# Patient Record
Sex: Female | Born: 1940 | Race: Black or African American | Hispanic: No | Marital: Married | Smoking: Former smoker
Health system: Southern US, Community
[De-identification: ages and names within clinical notes are randomized; demographics above are authoritative.]

## PROBLEM LIST (undated history)

## (undated) DIAGNOSIS — R3129 Other microscopic hematuria: Secondary | ICD-10-CM

## (undated) DIAGNOSIS — R011 Cardiac murmur, unspecified: Secondary | ICD-10-CM

## (undated) DIAGNOSIS — T7840XA Allergy, unspecified, initial encounter: Secondary | ICD-10-CM

## (undated) DIAGNOSIS — H269 Unspecified cataract: Secondary | ICD-10-CM

## (undated) DIAGNOSIS — E785 Hyperlipidemia, unspecified: Secondary | ICD-10-CM

## (undated) DIAGNOSIS — I10 Essential (primary) hypertension: Secondary | ICD-10-CM

## (undated) DIAGNOSIS — R739 Hyperglycemia, unspecified: Secondary | ICD-10-CM

## (undated) HISTORY — PX: COLONOSCOPY: SHX174

## (undated) HISTORY — DX: Hyperglycemia, unspecified: R73.9

## (undated) HISTORY — PX: ABDOMINAL HYSTERECTOMY: SHX81

## (undated) HISTORY — DX: Other microscopic hematuria: R31.29

## (undated) HISTORY — DX: Unspecified cataract: H26.9

## (undated) HISTORY — DX: Cardiac murmur, unspecified: R01.1

## (undated) HISTORY — DX: Allergy, unspecified, initial encounter: T78.40XA

## (undated) HISTORY — PX: CATARACT EXTRACTION: SUR2

## (undated) HISTORY — PX: TRIGGER FINGER RELEASE: SHX641

## (undated) HISTORY — DX: Hyperlipidemia, unspecified: E78.5

## (undated) HISTORY — DX: Essential (primary) hypertension: I10

---

## 1983-01-12 HISTORY — PX: TOTAL ABDOMINAL HYSTERECTOMY: SHX209

## 2004-09-04 ENCOUNTER — Ambulatory Visit (HOSPITAL_COMMUNITY): Admission: RE | Admit: 2004-09-04 | Discharge: 2004-09-04 | Payer: Self-pay | Admitting: Gastroenterology

## 2010-12-02 DIAGNOSIS — E119 Type 2 diabetes mellitus without complications: Secondary | ICD-10-CM | POA: Insufficient documentation

## 2010-12-02 DIAGNOSIS — IMO0002 Reserved for concepts with insufficient information to code with codable children: Secondary | ICD-10-CM | POA: Insufficient documentation

## 2014-10-11 ENCOUNTER — Encounter: Payer: Self-pay | Admitting: Gastroenterology

## 2014-10-29 ENCOUNTER — Ambulatory Visit (AMBULATORY_SURGERY_CENTER): Payer: Self-pay | Admitting: *Deleted

## 2014-10-29 VITALS — Ht 62.0 in | Wt 146.2 lb

## 2014-10-29 DIAGNOSIS — Z1211 Encounter for screening for malignant neoplasm of colon: Secondary | ICD-10-CM

## 2014-10-29 NOTE — Progress Notes (Signed)
No egg or soy allergy  No anesthesia or intubation problems per pt  No diet medications taken   

## 2014-11-06 ENCOUNTER — Telehealth: Payer: Self-pay | Admitting: Gastroenterology

## 2014-11-06 NOTE — Telephone Encounter (Signed)
Called patient, explained to her that she was given the Miralax split dose prep and that everything for this prep is purchased over the counter Told her to carry her prep instructions with her to her pharmacy and they would help her with what she needs

## 2014-11-12 ENCOUNTER — Ambulatory Visit (AMBULATORY_SURGERY_CENTER): Payer: Medicare Other | Admitting: Gastroenterology

## 2014-11-12 ENCOUNTER — Encounter: Payer: Self-pay | Admitting: Gastroenterology

## 2014-11-12 VITALS — BP 111/54 | HR 57 | Temp 97.6°F | Resp 19 | Ht 62.0 in | Wt 146.0 lb

## 2014-11-12 DIAGNOSIS — Z1211 Encounter for screening for malignant neoplasm of colon: Secondary | ICD-10-CM | POA: Diagnosis not present

## 2014-11-12 MED ORDER — SODIUM CHLORIDE 0.9 % IV SOLN: 500.0000 mL | INTRAVENOUS | Status: DC

## 2014-11-12 NOTE — Patient Instructions (Signed)
YOU HAD AN ENDOSCOPIC PROCEDURE TODAY AT THE [facility]:   Refer to the procedure report that was given to you for any specific questions about what was found during the examination.  If the procedure report does not answer your questions, please call your gastroenterologist to clarify.  If you requested that your care partner not be given the details of your procedure findings, then the procedure report has been included in a sealed envelope for you to review at your convenience later.  YOU SHOULD EXPECT: Some feelings of bloating in the abdomen. Passage of more gas than usual.  Walking can help get rid of the air that was put into your GI tract during the procedure and reduce the bloating. If you had a lower endoscopy (such as a colonoscopy or flexible sigmoidoscopy) you may notice spotting of blood in your stool or on the toilet paper. If you underwent a bowel prep for your procedure, you may not have a normal bowel movement for a few days.  Please Note:  You might notice some irritation and congestion in your nose or some drainage.  This is from the oxygen used during your procedure.  There is no need for concern and it should clear up in a day or so.  SYMPTOMS TO REPORT IMMEDIATELY:   Following lower endoscopy (colonoscopy or flexible sigmoidoscopy):  Excessive amounts of blood in the stool  Significant tenderness or worsening of abdominal pains  Swelling of the abdomen that is new, acute  Fever of 100F or higher   For urgent or emergent issues, a gastroenterologist can be reached at any hour by calling [phone].   DIET: Your first meal following the procedure should be a small meal and then it is ok to progress to your normal diet. Heavy or fried foods are harder to digest and may make you feel nauseous or bloated.  Likewise, meals heavy in dairy and vegetables can increase bloating.  Drink plenty of fluids but you should avoid alcoholic beverages for 24  hours.  ACTIVITY:  You should plan to take it easy for the rest of today and you should NOT DRIVE or use heavy machinery until tomorrow (because of the sedation medicines used during the test).    FOLLOW UP: Our staff will call the number listed on your records the next business day following your procedure to check on you and address any questions or concerns that you may have regarding the information given to you following your procedure. If we do not reach you, we will leave a message.  However, if you are feeling well and you are not experiencing any problems, there is no need to return our call.  We will assume that you have returned to your regular daily activities without incident.  If any biopsies were taken you will be contacted by phone or by letter within the next 1-3 weeks.  Please call us at [phone] if you have not heard about the biopsies in 3 weeks.    SIGNATURES/CONFIDENTIALITY: You and/or your care partner have signed paperwork which will be entered into your electronic medical record.  These signatures attest to the fact that that the information above on your After Visit Summary has been reviewed and is understood.  Full responsibility of the confidentiality of this discharge information lies with you and/or your care-partner.  Hemorrhoids, diverticulosis, high fiber diet-handouts given  No repeat screening colonoscopy due to age unless you have any problems.

## 2014-11-12 NOTE — Op Note (Signed)
[facility] [address] [place], [address]   COLONOSCOPY PROCEDURE REPORT  PATIENT: [name], [name]: 1940-11-07 , 74  yrs. old GENDER: female ENDOSCOPIST: Marsa ArisKavitha Colin Ellers, MD REFERRED XB:JYNWGNBY:Daniel Jarold MottoPatterson, M.D. PROCEDURE DATE:  11/12/2014 PROCEDURE:   Colonoscopy, screening First Screening Colonoscopy - Avg.  risk and is 50 yrs.  old or older Yes.  Prior Negative Screening - Now for repeat screening. 10 or more years since last screening  History of Adenoma - Now for follow-up colonoscopy & has been > or = to 3 yrs.  N/A  Polyps removed today? No Recommend repeat exam, <10 yrs? No ASA CLASS:   Class II INDICATIONS:Screening for colonic neoplasia and Colorectal Neoplasm Risk Assessment for this procedure is average risk. MEDICATIONS: Propofol 350 mg IV and Lidocaine 40 mg IV  DESCRIPTION OF PROCEDURE:   After the risks benefits and alternatives of the procedure were thoroughly explained, informed consent was obtained.  The digital rectal exam revealed no abnormalities of the rectum.   The LB 1528  endoscope was introduced through the anus and advanced to the cecum, which was identified by both the appendix and ileocecal valve. No adverse events experienced.   The quality of the prep was good.  The instrument was then slowly withdrawn as the colon was fully examined. Estimated blood loss is zero unless otherwise noted in this procedure report.   COLON FINDINGS: Boston prep score 3-3-3 (9) colonic mucosa appeared normal, few scattered sigmoid diverticula. Small internal hemorrhoids nonbleeding.  Poor anal sphincter tone, unable to hold air in the rectum and distal sigmoid colon but was able to visualize mucosa adequately.  Retroflexed views revealed internal hemorrhoids. The time to cecum = 14.6 Withdrawal time = 9.2   The scope was withdrawn and the procedure completed. COMPLICATIONS: There were no immediate  complications.  ENDOSCOPIC IMPRESSION: Sigmoid diverticulosis. Small internal hemorrhoids nonbleeding. Poor anal sphincter tone  RECOMMENDATIONS: Given your age, you will not need another colonoscopy for colon cancer screening or polyp surveillance.  These types of tests usually stop around the age 75. Anorectal manometry and pelvic floor physical therapy to improve sphincter tone  eSigned:  Marsa ArisKavitha Derren Suydam, MD 11/12/2014 10:06 AM      [name], [name] MR#: [id number]

## 2014-11-12 NOTE — Progress Notes (Signed)
Stable to RR 

## 2014-11-13 ENCOUNTER — Telehealth: Payer: Self-pay

## 2014-11-13 NOTE — Telephone Encounter (Signed)
  Follow up Call-  Call back number 11/12/2014  Post procedure Call Back phone  # [phone]  Permission to leave phone message Yes     Patient questions:  Do you have a fever, pain , or abdominal swelling? No. Pain Score  0 *  Have you tolerated food without any problems? Yes.    Have you been able to return to your normal activities? Yes.    Do you have any questions about your discharge instructions: Diet   No. Medications  No. Follow up visit  No.  Do you have questions or concerns about your Care? Yes.    Actions: * If pain score is 4 or above: No action needed, pain <4.  Pt said she still feel gasy today.  Pt said she was passing gas.  I advised her to continue to pass gas.  Avoid gasy foods until it resolves.  To call us back if not improving today. maw

## 2014-11-26 ENCOUNTER — Other Ambulatory Visit: Payer: Self-pay

## 2014-11-26 DIAGNOSIS — K6289 Other specified diseases of anus and rectum: Secondary | ICD-10-CM

## 2014-11-26 DIAGNOSIS — R159 Full incontinence of feces: Secondary | ICD-10-CM

## 2014-12-30 ENCOUNTER — Ambulatory Visit (HOSPITAL_COMMUNITY)
Admission: RE | Admit: 2014-12-30 | Discharge: 2014-12-30 | Disposition: A | Discharge status: Home / Self Care | Payer: Medicare Other | Source: Ambulatory Visit | Attending: Gastroenterology | Admitting: Gastroenterology

## 2014-12-30 ENCOUNTER — Encounter (HOSPITAL_COMMUNITY)
Admission: RE | Disposition: A | Discharge status: Home / Self Care | Payer: Self-pay | Source: Ambulatory Visit | Attending: Gastroenterology

## 2014-12-30 DIAGNOSIS — K6289 Other specified diseases of anus and rectum: Secondary | ICD-10-CM | POA: Diagnosis present

## 2014-12-30 DIAGNOSIS — R159 Full incontinence of feces: Secondary | ICD-10-CM | POA: Diagnosis not present

## 2014-12-30 HISTORY — PX: ANAL RECTAL MANOMETRY: SHX6358

## 2014-12-30 SURGERY — MANOMETRY, ANORECTAL

## 2014-12-31 ENCOUNTER — Encounter (HOSPITAL_COMMUNITY): Payer: Self-pay | Admitting: Gastroenterology

## 2015-02-06 DIAGNOSIS — R159 Full incontinence of feces: Secondary | ICD-10-CM | POA: Insufficient documentation

## 2015-04-15 ENCOUNTER — Encounter: Payer: Self-pay | Admitting: Gastroenterology

## 2015-04-15 ENCOUNTER — Other Ambulatory Visit: Payer: Self-pay

## 2015-04-15 DIAGNOSIS — K5902 Outlet dysfunction constipation: Secondary | ICD-10-CM

## 2015-04-24 ENCOUNTER — Encounter: Payer: Self-pay | Admitting: Physical Therapy

## 2015-04-24 ENCOUNTER — Ambulatory Visit: Payer: Medicare Other | Attending: Gastroenterology | Admitting: Physical Therapy

## 2015-04-24 DIAGNOSIS — R279 Unspecified lack of coordination: Secondary | ICD-10-CM | POA: Diagnosis present

## 2015-04-24 DIAGNOSIS — M6281 Muscle weakness (generalized): Secondary | ICD-10-CM

## 2015-04-24 NOTE — Therapy (Signed)
[facility] [address], [address] [place], [place], [address] Phone: [phone]   Fax:  [phone]  Physical Therapy Evaluation  Patient Details  Name: Geneieve Duell MRN: [id number] Date of Birth: [birth year] Referring Provider: Dr. Marsa Aris  Encounter Date: 04/24/2015      PT End of Session - 04/24/15 0851    Visit Number 1   Number of Visits 10   Date for PT Re-Evaluation 06/19/15   Authorization Type g-code 10th visti; Kx modifier on 15th   PT Start Time 0845   PT Stop Time 0925   PT Time Calculation (min) 40 min   Activity Tolerance Patient tolerated treatment well   Behavior During Therapy [facility] for tasks assessed/performed      Past Medical History  Diagnosis Date  . Allergy   . Cataract   . Heart murmur   . Hyperlipidemia   . Hypertension     Past Surgical History  Procedure Laterality Date  . Colonoscopy    . Abdominal hysterectomy    . Trigger finger release      left  . Anal rectal manometry N/A 12/30/2014    Procedure: ANO RECTAL MANOMETRY;  Surgeon: Napoleon Form, MD;  Location: [facility];  Service: Endoscopy;  Laterality: N/A;    There were no vitals filed for this visit.       Subjective Assessment - 04/24/15 0856    Subjective Patient reports she has been constipated. When have a bowel movement she will ses leakage later on in the day.  No urinary leakage.    Patient Stated Goals reduce the leakage.   Currently in Pain? No/denies            [facility] PT Assessment - 04/24/15 0001    Assessment   Medical Diagnosis K59.02 outlet dysfunction constipation   Referring Provider Dr. Marsa Aris   Prior Therapy None   Precautions   Precautions None   Restrictions   Weight Bearing Restrictions No   Balance Screen   Has the patient fallen in the past 6 months No   Has the patient had a decrease in activity level because of a fear of falling?  No   Is the patient  reluctant to leave their home because of a fear of falling?  No   Prior Function   Level of Independence Independent   Vocation Retired   Leisure goes to gym   Cognition   Overall Cognitive Status Within Functional Limits for tasks assessed   Observation/Other Assessments   Focus on Therapeutic Outcomes (FOTO)  41% limitation for boewl leakage survey Ck  goal is 30% limitation   Posture/Postural Control   Posture/Postural Control No significant limitations   ROM / Strength   AROM / PROM / Strength AROM;PROM;Strength   AROM   Overall AROM  Within functional limits for tasks performed   PROM   Right Hip External Rotation  40   Left Hip External Rotation  50   Strength   Right Hip ABduction 4/5   Left Hip ABduction 4/5   Palpation   Palpation comment firmness in abdomen                 Pelvic Floor Special Questions - 04/24/15 0001    Urinary Leakage No   Fecal incontinence Yes   Skin Integrity Intact   Pelvic Floor Internal Exam Patient confirmed identifiecation and approved therapist to assess muscle integrity and strength   Exam Type Rectal  Palpation No tenderness   Strength fair squeeze, definite lift   Strength # of seconds 4                  PT Education - 04/24/15 0917    Education provided Yes   Education Details abdominal massage, pelvis floor exericse   Person(s) Educated Patient   Methods Explanation;Demonstration;Handout;Verbal cues   Comprehension Verbalized understanding;Returned demonstration          PT Short Term Goals - 04/24/15 0927    PT SHORT TERM GOAL #1   Title Independent with HEP   Time 4   Period Weeks   Status New   PT SHORT TERM GOAL #2   Title understand how to massage the abdomen to promote bwel movement   Time 4   Period Weeks   Status New   PT SHORT TERM GOAL #3   Title ability  to contract the pelvic floor without contracting gluteals   Time 4   Period Weeks   Status New   PT SHORT TERM GOAL #4    Title abilit to contract pelvic floor while breathing   Time 4   Period Weeks   Status New   PT SHORT TERM GOAL #5   Title fecal leakage decreased >/= 25%   Time 4   Period Weeks   Status New           PT Long Term Goals - 04/24/15 [id number]    PT LONG TERM GOAL #1   Title Independent with HEP   Time 8   Period Weeks   Status New   PT LONG TERM GOAL #2   Title pelvic floor strength >/= 4/5 holding for 10 seconds   Time 8   Period Weeks   Status New   PT LONG TERM GOAL #3   Title fecal leakage decreased >/= 80%   Time 8   Period Weeks   Status New               Plan - 04/24/15 [id number]    Clinical Impression Statement Patient is a 75 year old female with diagnosis of outlet dysfunction constipation for several months. Patient reports no pain.  FOTO score is 41% limitation.  Patient reports 3 bowel movements per week.  Patient will leak feces  after she has had a bowel movement and ben doing her activities.  Patient reports her bowel movements are soft.  Patient  does not wear pads.  Pelvic floor strength is 3/5 holding for 3 seconds.  Patein is unable to breath while holding her pelvic floor contraction.  Patient needs verbal cues to contract anus instead of gluteals. Pateint is of low complexity.  Pateint will benefit from physical therapy to imporvve pelvic strength to reduce leakage.    Rehab Potential Excellent   Clinical Impairments Affecting Rehab Potential None   PT Frequency 1x / week   PT Duration 8 weeks   PT Treatment/Interventions Biofeedback;Therapeutic activities;Therapeutic exercise;Patient/family education;Neuromuscular re-education;Manual techniques   PT Next Visit Plan pelvic floor contraction with EMG   PT Home Exercise Plan toileting technique   Recommended Other Services None   Consulted and Agree with Plan of Care Patient      Patient will benefit from skilled therapeutic intervention in order to improve the following deficits and impairments:   Decreased endurance, Decreased strength, Decreased coordination  Visit Diagnosis: Muscle weakness (generalized) - Plan: PT plan of care cert/re-cert  Unspecified lack of coordination - Plan: PT plan  of care cert/re-cert      G-Codes - 05/02/15 0920    Functional Assessment Tool Used FOTO score is 41% limitation  goal is 30% limitation   Functional Limitation Other PT primary   Other PT Primary Current Status (Z6109) At least 40 percent but less than 60 percent impaired, limited or restricted   Other PT Primary Goal Status (U0454) At least 20 percent but less than 40 percent impaired, limited or restricted       Problem List Patient Active Problem List   Diagnosis Date Noted  . Fecal incontinence     Eulis Foster, PT 05-02-15 9:35 AM   [facility] [address] [place], [place], [address] Phone: [phone]   Fax:  [phone]  Name: Sonoma Firkus MRN: [id number] Date of Birth: [birth year]

## 2015-04-24 NOTE — Patient Instructions (Addendum)
About Abdominal Massage  Abdominal massage, also called external colon massage, is a self-treatment circular massage technique that can reduce and eliminate gas and ease constipation. The colon naturally contracts in waves in a clockwise direction starting from inside the right hip, moving up toward the ribs, across the belly, and down inside the left hip.  When you perform circular abdominal massage, you help stimulate your colon's normal wave pattern of movement called peristalsis.  It is most beneficial when done after eating.  Positioning You can practice abdominal massage with oil while lying down, or in the shower with soap.  Some people find that it is just as effective to do the massage through clothing while sitting or standing.  How to Massage Start by placing your finger tips or knuckles on your right side, just inside your hip bone.  . Make small circular movements while you move upward toward your rib cage.   . Once you reach the bottom right side of your rib cage, take your circular movements across to the left side of the bottom of your rib cage.  . Next, move downward until you reach the inside of your left hip bone.  This is the path your feces travel in your colon. . Continue to perform your abdominal massage in this pattern for 10 minutes each day.     You can apply as much pressure as is comfortable in your massage.  Start gently and build pressure as you continue to practice.  Notice any areas of pain as you massage; areas of slight pain may be relieved as you massage, but if you have areas of significant or intense pain, consult with your healthcare provider.  Other Considerations . General physical activity including bending and stretching can have a beneficial massage-like effect on the colon.  Deep breathing can also stimulate the colon because breathing deeply activates the same nervous system that supplies the colon.   . Abdominal massage should always be used in  combination with a bowel-conscious diet that is high in the proper type of fiber for you, fluids (primarily water), and a regular exercise program. Quick Contraction: Gravity Eliminated (Side-Lying)    Lie on left side, hips and knees slightly bent. Quickly squeeze then fully relax pelvic floor. Perform _1_ sets of __5_. Rest for __1_ seconds between sets. Do _3__ times a day.   Copyright  VHI. All rights reserved.  Slow Contraction: Gravity Eliminated (Side-Lying)    Lie on left side, hips and knees slightly bent. Slowly squeeze pelvic floor for _5__ seconds. Rest for _5__ seconds. Repeat _10__ times. Do __3_ times a day.   Copyright  VHI. All rights reserved.  [facility] [address], [address] [place], [place] [address] Phone # [phone] Fax [phone]

## 2015-05-20 ENCOUNTER — Encounter: Payer: Self-pay | Admitting: Physical Therapy

## 2015-05-20 ENCOUNTER — Ambulatory Visit: Payer: Medicare Other | Attending: Gastroenterology | Admitting: Physical Therapy

## 2015-05-20 DIAGNOSIS — M6281 Muscle weakness (generalized): Secondary | ICD-10-CM

## 2015-05-20 DIAGNOSIS — R279 Unspecified lack of coordination: Secondary | ICD-10-CM

## 2015-05-20 NOTE — Patient Instructions (Signed)
Toileting Techniques for Bowel Movements (Defecation) Using your belly (abdomen) and pelvic floor muscles to have a bowel movement is usually instinctive.  Sometimes people can have problems with these muscles and have to relearn proper defecation (emptying) techniques.  If you have weakness in your muscles, organs that are falling out, decreased sensation in your pelvis, or ignore your urge to go, you may find yourself straining to have a bowel movement.  You are straining if you are: . holding your breath or taking in a huge gulp of air and holding it  . keeping your lips and jaw tensed and closed tightly . turning red in the face because of excessive pushing or forcing . developing or worsening your  hemorrhoids . getting faint while pushing . not emptying completely and have to defecate many times a day  If you are straining, you are actually making it harder for yourself to have a bowel movement.  Many people find they are pulling up with the pelvic floor muscles and closing off instead of opening the anus. Due to lack pelvic floor relaxation and coordination the abdominal muscles, one has to work harder to push the feces out.  Many people have never been taught how to defecate efficiently and effectively.  Notice what happens to your body when you are having a bowel movement.  While you are sitting on the toilet pay attention to the following areas: . Jaw and mouth position . Angle of your hips   . Whether your feet touch the ground or not . Arm placement  . Spine position . Waist . Belly tension . Anus (opening of the anal canal)  An Evacuation/Defecation Plan   Here are the 4 basic points:  1. Lean forward enough for your elbows to rest on your knees 2. Support your feet on the floor or use a low stool if your feet don't touch the floor  3. Push out your belly as if you have swallowed a beach ball-you should feel a widening of your waist 4. Open and relax your pelvic floor muscles,  rather than tightening around the anus      The following conditions my require modifications to your toileting posture:  . If you have had surgery in the past that limits your back, hip, pelvic, knee or ankle flexibility . Constipation   Your healthcare practitioner may make the following additional suggestions and adjustments:  1) Sit on the toilet  a) Make sure your feet are supported. b) Notice your hip angle and spine position-most people find it effective to lean forward or raise their knees, which can help the muscles around the anus to relax  c) When you lean forward, place your forearms on your thighs for support  2) Relax suggestions a) Breath deeply in through your nose and out slowly through your mouth as if you are smelling the flowers and blowing out the candles. b) To become aware of how to relax your muscles, contracting and releasing muscles can be helpful.  Pull your pelvic floor muscles in tightly by using the image of holding back gas, or closing around the anus (visualize making a circle smaller) and lifting the anus up and in.  Then release the muscles and your anus should drop down and feel open. Repeat 5 times ending with the feeling of relaxation. c) Keep your pelvic floor muscles relaxed; let your belly bulge out. d) The digestive tract starts at the mouth and ends at the anal opening, so be   sure to relax both ends of the tube.  Place your tongue on the roof of your mouth with your teeth separated.  This helps relax your mouth and will help to relax the anus at the same time.  3) Empty (defecation) a) Keep your pelvic floor and sphincter relaxed, then bulge your anal muscles.  Make the anal opening wide.  b) Stick your belly out as if you have swallowed a beach ball. c) Make your belly wall hard using your belly muscles while continuing to breathe. Doing this makes it easier to open your anus. d) Breath out and give a grunt (or try using other sounds such as  ahhhh, shhhhh, ohhhh or grrrrrrr).  4) Finish a) As you finish your bowel movement, pull the pelvic floor muscles up and in.  This will leave your anus in the proper place rather than remaining pushed out and down. If you leave your anus pushed out and down, it will start to feel as though that is normal and give you incorrect signals about needing to have a bowel movement.    Sitting    Sit comfortably. Allow body's muscles to relax. Place hands on belly. Inhale slowly and deeply for _3__ seconds, so hands move out. Then take _3__ seconds to exhale. Repeat _5__ times. Do __2_ times a day. And just before you are going to have a bowel movement Copyright  VHI. All rights reserved.    Supine    Lie flat with pillow support. Allow body's muscles to relax. Place hands on belly. Inhale slowly and deeply for __3_ seconds, so hands move up. Then take _3__ seconds to exhale. Repeat __5_ times. Do _2__ times a day.  Copyright  VHI. All rights reserved.   Piriformis Stretch, Sitting    Sit, one ankle on opposite knee, same-side hand on crossed knee. Push down on knee, keeping spine straight. Lean torso forward, with flat back, until tension is felt in hamstrings and gluteals of crossed-leg side. Hold _30__ seconds.  Repeat __2_ times per session. Do _1__ sessions per day.  Copyright  VHI. All rights reserved.  Chair Sitting    Sit at edge of seat, spine straight, one leg extended. Put a hand on each thigh and bend forward from the hip, keeping spine straight. Allow hand on extended leg to reach toward toes. Support upper body with other arm. Hold _30__ seconds. Repeat _2__ times per session. Do _1__ sessions per day.  Copyright  VHI. All rights reserved.  [facility] [address], [address] [place], [place] [address] Phone # [phone] Fax [phone]

## 2015-05-20 NOTE — Therapy (Signed)
[facility] [address] [place], [place], [address] Phone: [phone]   Fax:  [phone]  Physical Therapy Treatment  Patient Details  Name: [name] MRN: [id number] Date of Birth: [birth year] Referring Provider: Dr. Marsa Aris  Encounter Date: 05/20/2015      PT End of Session - 05/20/15 0850    Visit Number 2   Number of Visits 10   Date for PT Re-Evaluation 06/19/15   Authorization Type g-code 10th visti; Kx modifier on 15th   PT Start Time 0850   PT Stop Time 0930   PT Time Calculation (min) 40 min   Activity Tolerance Patient tolerated treatment well   Behavior During Therapy [facility] for tasks assessed/performed      Past Medical History  Diagnosis Date  . Allergy   . Cataract   . Heart murmur   . Hyperlipidemia   . Hypertension     Past Surgical History  Procedure Laterality Date  . Colonoscopy    . Abdominal hysterectomy    . Trigger finger release      left  . Anal rectal manometry N/A 12/30/2014    Procedure: ANO RECTAL MANOMETRY;  Surgeon: Napoleon Form, MD;  Location: [facility];  Service: Endoscopy;  Laterality: N/A;    There were no vitals filed for this visit.      Subjective Assessment - 05/20/15 0851    Subjective I have not had to strain as much.  Very little leakage.  Patient reports 85-90% better.    Patient Stated Goals reduce the leakage.                         [facility] Adult PT Treatment/Exercise - 05/20/15 0001    Exercises   Exercises --  diphragmatic breathing with hand pressure for tactile cues   Manual Therapy   Manual Therapy Soft tissue mobilization;Myofascial release   Manual therapy comments sit on therapist hand as she releases the tissue between the coccyx and rectum   Soft tissue mobilization to abdoment for pressure on 4 points of abdoment with breathing   Myofascial Release one hand on abdoment and sacrum releasing the lower  abdomina tissue and tissue between the uterus and rectum, scar tissue release                PT Education - 05/20/15 0925    Education provided Yes   Education Details stretches, toileting techniique   Methods Explanation;Demonstration;Verbal cues;Handout   Comprehension Returned demonstration;Verbalized understanding          PT Short Term Goals - 05/20/15 6962    PT SHORT TERM GOAL #1   Title Independent with HEP   Time 4   Period Weeks   Status Achieved   PT SHORT TERM GOAL #2   Title understand how to massage the abdomen to promote bwel movement   Time 4   Period Weeks   Status Achieved   PT SHORT TERM GOAL #3   Title ability  to contract the pelvic floor without contracting gluteals   Time 4   Period Weeks   Status On-going   PT SHORT TERM GOAL #4   Title abilit to contract pelvic floor while breathing   Time 4   Period Weeks   Status On-going   PT SHORT TERM GOAL #5   Title fecal leakage decreased >/= 25%   Time 4   Period Weeks   Status Achieved  PT Long Term Goals - 04/24/15 0929    PT LONG TERM GOAL #1   Title Independent with HEP   Time 8   Period Weeks   Status New   PT LONG TERM GOAL #2   Title pelvic floor strength >/= 4/5 holding for 10 seconds   Time 8   Period Weeks   Status New   PT LONG TERM GOAL #3   Title fecal leakage decreased >/= 80%   Time 8   Period Weeks   Status New               Plan - 05/20/15 0929    Clinical Impression Statement Patient is a 75 year old female with diagnosis of outlet dysfunction constipation.  Patient reports fecal leakage is 80% better.  She is not straining to have a bowel movement better.  The bowels have a firmer consistency.  Patient will benefit form physical therapy to improve abdominal strength and pelvic floor coordination.    Rehab Potential Excellent   Clinical Impairments Affecting Rehab Potential None   PT Frequency 1x / week   PT Duration 8 weeks   PT  Treatment/Interventions Biofeedback;Therapeutic activities;Therapeutic exercise;Patient/family education;Neuromuscular re-education;Manual techniques   PT Next Visit Plan pelvic floor contraction with EMG   PT Home Exercise Plan progress as needed   Consulted and Agree with Plan of Care Patient      Patient will benefit from skilled therapeutic intervention in order to improve the following deficits and impairments:  Decreased endurance, Decreased strength, Decreased coordination  Visit Diagnosis: Muscle weakness (generalized)  Unspecified lack of coordination     Problem List Patient Active Problem List   Diagnosis Date Noted  . Fecal incontinence     Eulis FosterCheryl Gray, PT 05/20/2015 9:31 AM   [facility] [address], [address] [place], [place], [address] Phone: [phone]   Fax:  [phone]  Name: Sara SabinBetty Prince MRN: [id number] Date of Birth: Jul 11, 1940

## 2015-06-02 ENCOUNTER — Encounter: Payer: Self-pay | Admitting: Physical Therapy

## 2015-06-02 ENCOUNTER — Ambulatory Visit: Payer: Medicare Other | Admitting: Physical Therapy

## 2015-06-02 DIAGNOSIS — R279 Unspecified lack of coordination: Secondary | ICD-10-CM

## 2015-06-02 DIAGNOSIS — M6281 Muscle weakness (generalized): Secondary | ICD-10-CM

## 2015-06-02 NOTE — Patient Instructions (Signed)
Strengthening: Hip Abduction (Side-Lying)    Tighten muscles on buttocks and back  of left thigh, then lift leg _6___ inches from surface, keeping knee locked. Keep the hip forward and leg back. Repeat __15__ times per set. Do __1__ sets per session. Do _1___ sessions per day. Do 3 times per week  http://orth.exer.us/622   Copyright  VHI. All rights reserved.    Quick Contraction: Gravity Resisted (Standing)    Standing, quickly squeeze then fully relax pelvic floor. Perform _1__ sets of _5__. Rest for __1_ seconds between sets. Do 4___ times a day. Every day.   Copyright  VHI. All rights reserved.    Slow Contraction: Gravity Resisted (Standing)    Standing, slowly squeeze pelvic floor for _10__ seconds. Rest for __5_ seconds. Repeat _10__ times. Do _4__ times a day. Every day.  Do not hold breath.  Copyright  VHI. All rights reserved.   [facility] [address], [address] [place], [place] [address] Phone # [phone] Fax [phone]

## 2015-06-02 NOTE — Therapy (Signed)
[facility] [address] [place], [place], [address] Phone: [phone]   Fax:  [phone]  Physical Therapy Treatment  Patient Details  Name: [name] MRN: [id number] Date of Birth: [birth year] Referring Provider: Dr. Harl Bowie  Encounter Date: 06/02/2015      PT End of Session - 06/02/15 0927    Visit Number 3   Number of Visits 10   Date for PT Re-Evaluation 06/19/15   Authorization Type g-code 10th visti; Kx modifier on 15th   PT Start Time 0845   PT Stop Time 0927   PT Time Calculation (min) 42 min   Activity Tolerance Patient tolerated treatment well   Behavior During Therapy [facility] for tasks assessed/performed      Past Medical History  Diagnosis Date  . Allergy   . Cataract   . Heart murmur   . Hyperlipidemia   . Hypertension     Past Surgical History  Procedure Laterality Date  . Colonoscopy    . Abdominal hysterectomy    . Trigger finger release      left  . Anal rectal manometry N/A 12/30/2014    Procedure: ANO RECTAL MANOMETRY;  Surgeon: Mauri Pole, MD;  [facility];  Service: Endoscopy;  Laterality: N/A;    There were no vitals filed for this visit.      Subjective Assessment - 06/02/15 0849    Subjective Straining to have a bowel movement has decreased by 90%.  Sometimes after I eat I go right to the bathroom.  The bowel leakage is 90% better.  I will leak when I am standing more than normal.    Patient Stated Goals reduce the leakage.   Currently in Pain? No/denies                         [facility] Adult PT Treatment/Exercise - 06/02/15 0001    Manual Therapy   Manual Therapy Myofascial release;Soft tissue mobilization   Soft tissue mobilization scar tissue release in supine, liftng of lower intestines to improve mobility   Myofascial Release release of the pubovisceral ligaments on lower abdomen in supine; While leaning over the mat,  therapist pullng genlty on the lower abdomen to release the myofascia                PT Education - 06/02/15 0924    Education provided Yes   Education Details pelvic floor contraction in standing   Person(s) Educated Patient   Methods Explanation;Demonstration;Verbal cues;Handout   Comprehension Returned demonstration;Verbalized understanding          PT Short Term Goals - 06/02/15 0927    PT SHORT TERM GOAL #1   Title Independent with HEP   Time 4   Period Weeks   Status Achieved   PT SHORT TERM GOAL #2   Title understand how to massage the abdomen to promote bwel movement   Time 4   Period Weeks   Status Achieved   PT SHORT TERM GOAL #4   Title abilit to contract pelvic floor while breathing   Time 4   Status On-going   PT SHORT TERM GOAL #5   Title fecal leakage decreased >/= 25%   Time 4   Period Weeks   Status Achieved           PT Long Term Goals - 06/02/15 4665    PT LONG TERM GOAL #3   Title fecal leakage decreased >/=  80%   Time 8   Period Weeks   Status Achieved               Plan - 06/02/15 6468    Clinical Impression Statement Patient is a 75 year old female with diagnosis of outlet dysfunction constipation. Patient continues to have some myofascial tightness in the lower abdominal area. Patient reports fecal leakage is 80% better and happens when she stands long period of times.  Patient has learned pelvic floor strength in standing.  Patient reports her straining to have a bowel movement is 90% better.  Patient has met all of her STG's.  Patient has learned how to strengthen her hip abductors.  Patient will benefit from physical therapy to reduce fecal leakage in standing.    Rehab Potential Excellent   Clinical Impairments Affecting Rehab Potential None   PT Frequency 1x / week   PT Duration 8 weeks   PT Treatment/Interventions Biofeedback;Therapeutic activities;Therapeutic exercise;Patient/family education;Neuromuscular  re-education;Manual techniques   PT Next Visit Plan See patient prior to 06/19/2015 to see if she is ready for discharge, work on standing pelvic floor contraction   PT Home Exercise Plan progress as needed   Consulted and Agree with Plan of Care Patient      Patient will benefit from skilled therapeutic intervention in order to improve the following deficits and impairments:  Decreased endurance, Decreased strength, Decreased coordination  Visit Diagnosis: Muscle weakness (generalized)  Unspecified lack of coordination     Problem List Patient Active Problem List   Diagnosis Date Noted  . Fecal incontinence     Earlie Counts, PT 06/02/2015 9:41 AM    [facility] [address] [place], [place], [address] Phone: [phone]   Fax:  [phone]  Name: [name] MRN: [id number] Date of Birth: 1940/02/22

## 2015-06-16 ENCOUNTER — Encounter: Payer: Self-pay | Admitting: Physical Therapy

## 2015-06-16 ENCOUNTER — Ambulatory Visit: Payer: Medicare Other | Attending: Gastroenterology | Admitting: Physical Therapy

## 2015-06-16 DIAGNOSIS — M6281 Muscle weakness (generalized): Secondary | ICD-10-CM | POA: Insufficient documentation

## 2015-06-16 DIAGNOSIS — R279 Unspecified lack of coordination: Secondary | ICD-10-CM | POA: Diagnosis present

## 2015-06-16 NOTE — Therapy (Signed)
[facility] [address], [place], [place], [address] Phone: [phone]   Fax:  [phone]  Physical Therapy Treatment  Patient Details  Name: [name] MRN: [id number] Date of Birth: 04-25-1940 Referring Provider: Dr. Harl Bowie  Encounter Date: 06/16/2015      PT End of Session - 06/16/15 0959    Visit Number 4   Date for PT Re-Evaluation 06/19/15   Authorization Type g-code 10th visti; Kx modifier on 15th   PT Start Time 0930   PT Stop Time 1008   PT Time Calculation (min) 38 min   Activity Tolerance Patient tolerated treatment well   Behavior During Therapy [facility] for tasks assessed/performed      Past Medical History  Diagnosis Date  . Allergy   . Cataract   . Heart murmur   . Hyperlipidemia   . Hypertension     Past Surgical History  Procedure Laterality Date  . Colonoscopy    . Abdominal hysterectomy    . Trigger finger release      left  . Anal rectal manometry N/A 12/30/2014    Procedure: ANO RECTAL MANOMETRY;  Surgeon: Mauri Pole, MD;  [facility];  Service: Endoscopy;  Laterality: N/A;    There were no vitals filed for this visit.      Subjective Assessment - 06/16/15 0935    Subjective Fecal leakage decreased by 95%. No issues in standing.  I may happen after I eat and go to the bathroom. No straining with a bowel movement.    Patient Stated Goals reduce the leakage.   Currently in Pain? No/denies            [facility] PT Assessment - 06/16/15 0001    Assessment   Medical Diagnosis K59.02 outlet dysfunction constipation   Prior Therapy None   Precautions   Precautions None   Restrictions   Weight Bearing Restrictions No   Balance Screen   Has the patient fallen in the past 6 months No   Has the patient had a decrease in activity level because of a fear of falling?  No   Is the patient reluctant to leave their home because of a fear of falling?  No    Prior Function   Level of Independence Independent   Vocation Retired   Leisure goes to gym   Cognition   Overall Cognitive Status Within Functional Limits for tasks assessed   Observation/Other Assessments   Focus on Therapeutic Outcomes (FOTO)  40% limitation   Posture/Postural Control   Posture/Postural Control No significant limitations   AROM   Overall AROM  Within functional limits for tasks performed   PROM   Right Hip External Rotation  55   Left Hip External Rotation  55   Strength   Right Hip ABduction 5/5   Left Hip ABduction 5/5   Palpation   Palpation comment slight firmness                  Pelvic Floor Special Questions - 06/16/15 0001    Pelvic Floor Internal Exam Patient confirmed identifiecation and approved therapist to assess muscle integrity and strength   Exam Type Rectal   Strength strong squeeze, against strong resistance           [facility] Adult PT Treatment/Exercise - 06/16/15 0001    Exercises   Exercises --  reviewed HEP and patient is independent   Manual Therapy   Manual Therapy Myofascial release;Soft  tissue mobilization   Soft tissue mobilization scar tissue release in supine, liftng of lower intestines to improve mobility   Myofascial Release release of the pubovisceral ligaments on lower abdomen in supine; While leaning over the mat, therapist pullng genlty on the lower abdomen to release the myofascia                  PT Short Term Goals - 06/02/15 0927    PT SHORT TERM GOAL #1   Title Independent with HEP   Time 4   Period Weeks   Status Achieved   PT SHORT TERM GOAL #2   Title understand how to massage the abdomen to promote bwel movement   Time 4   Period Weeks   Status Achieved   PT SHORT TERM GOAL #4   Title abilit to contract pelvic floor while breathing   Time 4   Status On-going   PT SHORT TERM GOAL #5   Title fecal leakage decreased >/= 25%   Time 4   Period Weeks   Status Achieved            PT Long Term Goals - 06-29-2015 1191    PT LONG TERM GOAL #1   Title Independent with HEP   Time 8   Period Weeks   Status Achieved   PT LONG TERM GOAL #2   Title pelvic floor strength >/= 4/5 holding for 10 seconds   Time 8   Period Weeks   Status Achieved   PT LONG TERM GOAL #3   Title fecal leakage decreased >/= 80%   Time 8   Period Weeks   Status Achieved               Plan - 06-29-2015 0957    Clinical Impression Statement Patient is a 75 year old female wtih diagnosis of outlet dysfunction constipation.  Patient has met all of her goals.  Patient reports her fecal leakage is 95% better.  She has no fecal leakage in standing.  Pelvic floor strength has increased to 5/5.  Bilateral hip strength is 5/5.  Patient is independent with her HEP.    Rehab Potential Excellent   Clinical Impairments Affecting Rehab Potential None   PT Frequency 1x / week   PT Duration 8 weeks   PT Treatment/Interventions Biofeedback;Therapeutic activities;Therapeutic exercise;Patient/family education;Neuromuscular re-education;Manual techniques   PT Next Visit Plan Discharge to HEP   Recommended Other Services Current HEP   Consulted and Agree with Plan of Care Patient      Patient will benefit from skilled therapeutic intervention in order to improve the following deficits and impairments:  Decreased endurance, Decreased strength, Decreased coordination  Visit Diagnosis: Muscle weakness (generalized)  Unspecified lack of coordination       G-Codes - 29-Jun-2015 1007    Functional Assessment Tool Used FOTO score is 40% limitation   Functional Limitation Other PT primary   Other PT Primary Current Status (Y7829) At least 40 percent but less than 60 percent impaired, limited or restricted   Other PT Primary Goal Status (F6213) At least 20 percent but less than 40 percent impaired, limited or restricted      Problem List Patient Active Problem List   Diagnosis Date Noted  . Fecal  incontinence     Earlie Counts, PT 06/29/15 10:10 AM    [facility] [address] [place], [place], [address] Phone: [phone]   Fax:  [phone]  Name: [name]  MRN: [id number] Date of Birth: 08-13-1940  PHYSICAL THERAPY DISCHARGE SUMMARY  Visits from Start of Care: 4 Current functional level related to goals / functional outcomes: See above.    Remaining deficits: See above.    Education / Equipment: HEP Plan: Patient agrees to discharge.  Patient goals were met. Patient is being discharged due to meeting the stated rehab goals. Thank you for the referral. Earlie Counts, PT 06/16/2015 10:10 AM   ?????

## 2015-06-19 ENCOUNTER — Encounter: Payer: Self-pay | Admitting: Gastroenterology

## 2015-06-19 ENCOUNTER — Ambulatory Visit (INDEPENDENT_AMBULATORY_CARE_PROVIDER_SITE_OTHER): Payer: Medicare Other | Admitting: Gastroenterology

## 2015-06-19 VITALS — BP 132/60 | HR 84 | Ht 61.75 in | Wt 147.1 lb

## 2015-06-19 DIAGNOSIS — K5902 Outlet dysfunction constipation: Secondary | ICD-10-CM

## 2015-06-19 MED ORDER — LINACLOTIDE 72 MCG PO CAPS: 72.0000 ug | ORAL_CAPSULE | Freq: Every day | ORAL | Status: DC

## 2015-06-19 NOTE — Progress Notes (Signed)
[name]    [id number]    [birth year]  Primary Care Physician:PATERSON,DANIEL G, MD  Referring Physician: Jarome Matinaniel Paterson, MD [address] [place], [place] [address]  Chief complaint: Constipation  HPI: 75 year old female here for follow-up visit after completion of pelvic floor physical therapy for fecal incontinence and also dyssynergia defecation. She noticed significant improvement after 4 sessions and was discharged from PT . She is currently having 2 bowel movements a week and denies any episodes of fecal incontinence . Constipations worse when she travels , currently not on any laxatives . She does eat increased amounts of dietary fiber and tries to drink plenty of fluids . Denies any nausea, vomiting, abdominal pain, melena or bright red blood per rectum    Outpatient Encounter Prescriptions as of 06/19/2015  Medication Sig  . aspirin 81 MG chewable tablet Chew 81 mg by mouth daily.  Marland Kitchen. CALCIUM PO Take 250 mg by mouth daily.  . ergocalciferol (VITAMIN D2) 50000 UNITS capsule Take 50,000 Units by mouth. 50,000 units every other week  . furosemide (LASIX) 20 MG tablet Take 20 mg by mouth daily.  Marland Kitchen. losartan (COZAAR) 50 MG tablet Take 50 mg by mouth daily.  . Multiple Vitamin (MULTIVITAMIN) tablet Take 1 tablet by mouth daily.  . simvastatin (ZOCOR) 40 MG tablet Take 40 mg by mouth daily at 6 PM.  . vitamin E 100 UNIT capsule Take 100 Units by mouth daily.  Marland Kitchen. linaclotide (LINZESS) 72 MCG capsule Take 1 capsule (72 mcg total) by mouth daily before breakfast.  . Wheat Dextrin (BENEFIBER) POWD Use 1 tablespoon three times a day with meals  . [DISCONTINUED] potassium chloride (K-DUR) 10 MEQ tablet Take 10 mEq by mouth daily.   No facility-administered encounter medications on file as of 06/19/2015.    Allergies as of 06/19/2015  . (No Known Allergies)    Past Medical History  Diagnosis Date  . Allergy   . Cataract   . Heart murmur   . Hyperlipidemia   .  Hypertension     Past Surgical History  Procedure Laterality Date  . Colonoscopy    . Abdominal hysterectomy    . Trigger finger release      left  . Anal rectal manometry N/A 12/30/2014    Procedure: ANO RECTAL MANOMETRY;  Surgeon: Napoleon FormKavitha Osie Merkin V, MD;  [facility];  Service: Endoscopy;  Laterality: N/A;    Family History  Problem Relation Age of Onset  . Colon cancer Neg Hx   . Esophageal cancer Neg Hx   . Stomach cancer Neg Hx   . Rectal cancer Neg Hx     Social History   Social History  . Marital Status: Married    Spouse Name: N/A  . Number of Children: N/A  . Years of Education: N/A   Occupational History  . Not on file.   Social History Main Topics  . Smoking status: Former Games developermoker  . Smokeless tobacco: Never Used  . Alcohol Use: No  . Drug Use: No  . Sexual Activity: Not on file   Other Topics Concern  . Not on file   Social History Narrative      Review of systems: Review of Systems  Constitutional: Negative for fever and chills.  HENT: Negative.   Eyes: Negative for blurred vision.  Respiratory: Negative for cough, shortness of breath and wheezing.   Cardiovascular: Negative for chest pain and palpitations.  Gastrointestinal: as per HPI  Genitourinary: Negative for dysuria, urgency, frequency and hematuria.  Musculoskeletal: Negative for myalgias, back pain and joint pain.  Skin: Negative for itching and rash.  Neurological: Negative for dizziness, tremors, focal weakness, seizures and loss of consciousness.  Endo/Heme/Allergies: Negative for environmental allergies.  Psychiatric/Behavioral: Negative for depression, suicidal ideas and hallucinations.  All other systems reviewed and are negative.   Physical Exam: Filed Vitals:   06/19/15 1020  BP: 132/60  Pulse: 84   Gen:      No acute distress HEENT:  EOMI, sclera anicteric Neck:     No masses; no thyromegaly Lungs:    Clear to auscultation bilaterally; normal respiratory  effort CV:         Regular rate and rhythm; no murmurs Abd:      + bowel sounds; soft, non-tender; no palpable masses, no distension Ext:    No edema; adequate peripheral perfusion Skin:      Warm and dry; no rash Neuro: alert and oriented x 3 Psych: normal mood and affect  Data Reviewed: Reviewed chart in epic  Assessment and Plan/Recommendations: 75 year old female with history of fecal incontinence dyssynergia defecation, predicted 4 sessions of pelvic floor physical therapy with significant improvement of fecal incontinence  but continues to have irregular bowel movements with constipation We'll start low-dose linzess 72 g daily Benefiber 1 tablespoon 3 times daily with meals Increase dietary fiber and fluid intake Continue pelvic floor exercises to strengthen anal sphincter and pelvic floor tone Return as needed  Ladell Pier , MD [phone] Mon-Fri 8a-5p [phone] after 5p, weekends, holidays  CC: Jarome Matin, MD

## 2015-06-19 NOTE — Patient Instructions (Signed)
We have sent your Linzess to your pharmacy take on an empty stomach before breakfast Use Benefiber 1 tablespoon three times a day with meals Follow up as needed

## 2015-07-11 ENCOUNTER — Telehealth: Payer: Self-pay | Admitting: Gastroenterology

## 2015-07-11 NOTE — Telephone Encounter (Signed)
Patient is having daily bowel movements. No fecal incontinence or diarrhea. She will continue her present therapy. She understands she can call PRN.

## 2016-10-01 DIAGNOSIS — I10 Essential (primary) hypertension: Secondary | ICD-10-CM | POA: Insufficient documentation

## 2016-10-01 DIAGNOSIS — E785 Hyperlipidemia, unspecified: Secondary | ICD-10-CM | POA: Insufficient documentation

## 2017-05-07 DIAGNOSIS — M25562 Pain in left knee: Secondary | ICD-10-CM | POA: Insufficient documentation

## 2019-01-28 ENCOUNTER — Ambulatory Visit: Payer: Medicare Other | Attending: Internal Medicine

## 2019-01-28 DIAGNOSIS — Z23 Encounter for immunization: Secondary | ICD-10-CM | POA: Insufficient documentation

## 2019-01-28 NOTE — Progress Notes (Signed)
   Covid-19 Vaccination Clinic  Name:  [name]    MRN: [id number] DOB: 08/04/1940  01/28/2019  [name] was observed post Covid-19 immunization for 15 mins without incidence. She was provided with Vaccine Information Sheet and instruction to access the V-Safe system.   [name] was instructed to call 911 with any severe reactions post vaccine: Marland Kitchen Difficulty breathing  . Swelling of your face and throat  . A fast heartbeat  . A bad rash all over your body  . Dizziness and weakness

## 2019-02-15 ENCOUNTER — Ambulatory Visit: Payer: Medicare Other | Attending: Internal Medicine

## 2019-02-15 DIAGNOSIS — Z23 Encounter for immunization: Secondary | ICD-10-CM | POA: Insufficient documentation

## 2019-02-15 NOTE — Progress Notes (Signed)
   Covid-19 Vaccination Clinic  Name:  [name]    MRN: [id number] DOB: June 12, 1940  02/15/2019  Ms. [name] was observed post Covid-19 immunization for 15 minutes without incidence. She was provided with Vaccine Information Sheet and instruction to access the V-Safe system.   Ms. [name] was instructed to call 911 with any severe reactions post vaccine: Marland Kitchen Difficulty breathing  . Swelling of your face and throat  . A fast heartbeat  . A bad rash all over your body  . Dizziness and weakness    Immunizations Administered    Name Date Dose VIS Date Route   Pfizer COVID-19 Vaccine 02/15/2019 10:20 AM 0.3 mL 12/22/2018 Intramuscular   Manufacturer: [employer], [employer]   Lot: [id number]   NDC: [address]-1

## 2021-04-20 DIAGNOSIS — M545 Low back pain, unspecified: Secondary | ICD-10-CM | POA: Insufficient documentation

## 2021-05-14 ENCOUNTER — Other Ambulatory Visit (HOSPITAL_COMMUNITY): Payer: Self-pay | Admitting: Hematology & Oncology

## 2021-05-14 ENCOUNTER — Other Ambulatory Visit: Payer: Self-pay | Admitting: Hematology & Oncology

## 2021-05-14 DIAGNOSIS — R630 Anorexia: Secondary | ICD-10-CM

## 2021-05-14 DIAGNOSIS — R634 Abnormal weight loss: Secondary | ICD-10-CM

## 2021-05-15 ENCOUNTER — Ambulatory Visit (HOSPITAL_COMMUNITY)
Admission: RE | Admit: 2021-05-15 | Discharge: 2021-05-15 | Disposition: A | Discharge status: Home / Self Care | Payer: Medicare Other | Source: Ambulatory Visit | Attending: Hematology & Oncology | Admitting: Hematology & Oncology

## 2021-05-15 DIAGNOSIS — R634 Abnormal weight loss: Secondary | ICD-10-CM | POA: Diagnosis not present

## 2021-05-15 DIAGNOSIS — R630 Anorexia: Secondary | ICD-10-CM | POA: Insufficient documentation

## 2021-05-15 MED ORDER — IOHEXOL 300 MG/ML  SOLN
100.0000 mL | Freq: Once | INTRAMUSCULAR | Status: AC | PRN
  Administered 2021-05-15: 100 mL via INTRAVENOUS

## 2021-05-15 MED ORDER — IOHEXOL 9 MG/ML PO SOLN: 500.0000 mL | ORAL | Status: AC

## 2021-05-15 MED ORDER — IOHEXOL 9 MG/ML PO SOLN
ORAL | Status: AC
  Administered 2021-05-15: 1000 mL via ORAL
  Filled 2021-05-15: qty 1000

## 2021-05-15 MED ORDER — SODIUM CHLORIDE (PF) 0.9 % IJ SOLN
INTRAMUSCULAR | Status: AC
  Filled 2021-05-15: qty 50

## 2021-07-22 ENCOUNTER — Encounter: Payer: Self-pay | Admitting: *Deleted

## 2021-07-23 ENCOUNTER — Ambulatory Visit: Payer: Medicare Other | Admitting: Neurology

## 2021-07-23 ENCOUNTER — Encounter: Payer: Self-pay | Admitting: Neurology

## 2021-07-23 VITALS — BP 146/66 | HR 62 | Ht 62.0 in | Wt 124.2 lb

## 2021-07-23 DIAGNOSIS — R011 Cardiac murmur, unspecified: Secondary | ICD-10-CM

## 2021-07-23 DIAGNOSIS — D508 Other iron deficiency anemias: Secondary | ICD-10-CM | POA: Diagnosis not present

## 2021-07-23 DIAGNOSIS — R269 Unspecified abnormalities of gait and mobility: Secondary | ICD-10-CM | POA: Diagnosis not present

## 2021-07-23 DIAGNOSIS — R799 Abnormal finding of blood chemistry, unspecified: Secondary | ICD-10-CM | POA: Diagnosis not present

## 2021-07-23 NOTE — Patient Instructions (Addendum)
It was nice to meet you both today.  I am glad to hear that your walking and lower body strength have improved after therapy. Since you have right shoulder pain, I recommend follow-up with your orthopedic specialist for this.    I do not see any signs or symptoms of parkinson's like disease or what we call parkinsonism.   Please remember to drink plenty of fluid at least 6 glasses (8 oz each), eat healthy meals and do not skip any meals. Try to eat protein with a every meal and eat a healthy snack such as fruit or nuts in between meals. Try to keep a regular sleep-wake schedule and try to exercise daily, particularly in the form of walking.  20-30 Use a cane for gait safety if needed.   As far as your medications are concerned, I do not recommend any new medications from my end of things or any additional testing.  You had extensive blood work in May 2023 and we will request those results from your primary care's office.    We do not have to make a scheduled follow-up appointment, I would be happy to see you back as needed.  Please follow-up with your primary care regarding your abnormal liver function and anemia.  I noticed a heart murmur.  This does not appear to be new, nevertheless, please talk to your primary care about potentially seeing a heart specialist called cardiologist.

## 2021-07-23 NOTE — Progress Notes (Addendum)
Subjective:    Patient ID: [name] is a 81 y.o. female.  HPI    Star Age, MD, PhD [facility] [address], [address] [place], [place] [address]  Dear Dr. Philip Aspen,   I saw your patient, [name], upon your kind request in my neurologic clinic today for initial consultation of her gait disorder.  The patient is accompanied by her husband today.  As you know, [name] is an 81 year old right-handed woman with an underlying history of hyperlipidemia, hypertension, diabetes, weight loss, anemia, reflux disease, back pain, right shoulder pain, and vitamin D deficiency, who reports a history of low back pain.  She now has right shoulder pain.  She feels that her walking is better.  She went through therapy and finished about last month, she saw orthopedics, I do not see any orthopedic records in epic.  Her husband reports that they did take x-rays but did not address her right shoulder pain as she did not have it at the time but has since developed discomfort in her right shoulder.  She fell about a month ago in the bathroom, did not hurt herself, was able to get up by herself, does not believe that she hit her head.  She is not very elaborate with her history, she is not very forthcoming and seems to downplay her symptoms.  Her husband indicates that she gets frustrated easily with him, accuses him of doing things that he does not do such as moving her things around and misplaced them.  Patient reports that he does do these things and does not apologize and does not admit that he is at fault.  She follows she her husband believes that this is not a significant problem at this time.  She denies any one-sided weakness or numbness.  She reports that if she had lower leg weakness, she has improved.  She does believe that therapy has helped.  Her father had Parkinson's disease, her mother had memory loss and she had a brother who had memory loss.  She quit  smoking some 40 years ago, she does not currently drink any alcohol.  She does not drink caffeine daily.  She believes that she drinks water enough throughout the day. She sleeps fairly well but does have some discomfort particularly in the right shoulder.  Her appetite is a little bit better, taste is a little bit better, no significant change in her weight.    I reviewed your office notes from 05/13/21 (she saw Toy Baker, NP) and 05/21/21.  She was noted to have a slow and wide-based gait.  She was felt to have mild forgetfulness. She had blood work through your office on 05/21/2021 and I reviewed the results: CMP showed elevated blood sugar at 147, creatinine 0.8, sodium 135, potassium 3.9, BUN 18, alk phos 79, AST mildly elevated at 63, ALT mildly elevated at 137.  CBC with differential showed hemoglobin low at 9.8, hematocrit low at 28.4, platelets 281, WBC 8.23.  She has had appetite loss recently and weight loss.  She had extensive blood work at her visit on 05/13/2021 including ESR, ANA, [facility] spotted fever antibodies, CK, Lyme antibodies, CMP, CBC, TSH, and vitamin B12.  We will request those test results from your office.   She had a recent CT abdomen and pelvis with contrast on 05/15/2021 and I reviewed the results: IMPRESSION: Minimal sigmoid diverticulosis without evidence of diverticulitis.   No acute intra-abdominal or intrapelvic abnormalities.  Aortic Atherosclerosis (ICD10-I70.0).  Addendum, 08/03/2021: I received blood test results from Dr. Shon Baton office from 05/13/2021 and I reviewed the results: CMP showed glucose of 137, AST 181, ALT 181, BUN 18, creatinine 0.7.  CBC with differential and platelets showed hemoglobin of 10.4, hematocrit slightly below normal.  Platelets 208.  TSH was 1.94, ESR 47, CK level 123, B12 1495, CRP 5, ANA negative, [place] spotted fever IgG negative.  Her Past Medical History Is Significant For: Past Medical History:  Diagnosis Date    Allergy    Cataract    Heart murmur    Hyperglycemia    Hyperlipidemia    Hypertension    Microscopic hematuria     Her Past Surgical History Is Significant For: Past Surgical History:  Procedure Laterality Date   ABDOMINAL HYSTERECTOMY     ANAL RECTAL MANOMETRY N/A 12/30/2014   Procedure: ANO RECTAL MANOMETRY;  Surgeon: Mauri Pole, MD;  Location: [facility];  Service: Endoscopy;  Laterality: N/A;   CATARACT EXTRACTION Bilateral    2019   COLONOSCOPY     TOTAL ABDOMINAL HYSTERECTOMY Left 1985   TRIGGER FINGER RELEASE     left    Her Family History Is Significant For: Family History  Problem Relation Age of Onset   Goiter Mother    Parkinson's disease Father    Colon cancer Neg Hx    Esophageal cancer Neg Hx    Stomach cancer Neg Hx    Rectal cancer Neg Hx     Her Social History Is Significant For: Social History   Socioeconomic History   Marital status: Married    Spouse name: Not on file   Number of children: Not on file   Years of education: Not on file   Highest education level: Not on file  Occupational History   Not on file  Tobacco Use   Smoking status: Former   Smokeless tobacco: Never  Vaping Use   Vaping Use: Never used  Substance and Sexual Activity   Alcohol use: No   Drug use: No   Sexual activity: Not on file  Other Topics Concern   Not on file  Social History Narrative   Caffeine rare.     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    Children one.     Social Determinants of Health   Financial Resource Strain: Not on file  Food Insecurity: Not on file  Transportation Needs: Not on file  Physical Activity: Not on file  Stress: Not on file  Social Connections: Not on file    Her Allergies Are:  Allergies  Allergen Reactions   Estrogens     Bloating   :   Her Current Medications Are:  Outpatient Encounter Medications as of 07/23/2021  Medication Sig   CALCIUM PO Take 600 mg by mouth daily.    ergocalciferol (VITAMIN D2) 50000 UNITS capsule Take 50,000 Units by mouth once a week. 50,000 units every other week   furosemide (LASIX) 20 MG tablet Take 20 mg by mouth daily.   lisinopril (ZESTRIL) 5 MG tablet Take 5 mg by mouth daily.   metoprolol succinate (TOPROL-XL) 100 MG 24 hr tablet Take 100 mg by mouth daily.   Multiple Vitamin (MULTIVITAMIN) tablet Take 1 tablet by mouth daily.   pantoprazole (PROTONIX) 40 MG tablet Take 40 mg by mouth daily.   potassium chloride (KLOR-CON) 10 MEQ tablet Take 10 mEq by mouth daily.   sulindac (CLINORIL) 150 MG tablet TAKE 1  TABLET BY MOUTH WITH FOOD AS NEEDED FOR PAIN UP TO TWICE A DAY   [DISCONTINUED] ezetimibe (ZETIA) 10 MG tablet Take 10 mg by mouth daily.   [DISCONTINUED] simvastatin (ZOCOR) 40 MG tablet Take 40 mg by mouth daily at 6 PM.   No facility-administered encounter medications on file as of 07/23/2021.  :   Review of Systems:  Out of a complete 14 point review of systems, all are reviewed and negative with the exception of these symptoms as listed below:  Review of Systems  Neurological:        Several months. With back pain then shoulder aching.     Objective:  Neurological Exam  Physical Exam Physical Examination:   Vitals:   07/23/21 0810  BP: (!) 146/66  Pulse: 62   General Examination: The patient is a very pleasant 81 y.o. female in no acute distress. She appears well-developed and well-nourished and well groomed.   HEENT: Normocephalic, atraumatic, pupils are equal, round and reactive to light, extraocular tracking is good without limitation to gaze excursion or nystagmus noted. Hearing is grossly intact. Face is symmetric with normal facial animation. Speech is clear with no dysarthria noted. There is no hypophonia. There is no lip, neck/head, jaw or voice tremor. Neck is supple with full range of passive and active motion. There are no carotid bruits on auscultation. Normal tongue movements.   Chest: Clear to  auscultation without wheezing, rhonchi or crackles noted.  Heart: S1+S2+0, regular and normal with 3/6 systolic murmur, no rubs or gallops noted.  1 extra beat with compensatory pause noted.  Abdomen: Soft, non-tender and non-distended.  Extremities: There is trace pitting edema in the distal lower extremities bilaterally.   Skin: Warm and dry without trophic changes noted.   Musculoskeletal: exam reveals no obvious joint deformities, but reports right shoulder pain.    Neurologically:  Mental status: The patient is awake, alert and oriented in all 4 spheres. Her immediate and remote memory, attention, language skills and fund of knowledge are fairly appropriate but she does not have ability to give many details about her history and recent chronological events.  She does appear to downplay her symptoms.  She reports getting frustrated easily and her husband adds that she tends to accuse him of things such as moving her things around including her pocket book and misplacing her items which he reports he does not do.  This is cause for friction.  Mood and affect appear to be normal. Cranial nerves II - XII are as described above under HEENT exam.  Motor exam: Normal bulk, strength and tone is noted. There is no tremor, Romberg is not tested due to safety concerns.  She did not bring the walking aid.  Reflexes are 2+, toes are downgoing bilaterally.  Fine motor skills and coordination: Grossly intact.    Cerebellar testing: No dysmetria or intention tremor. There is no truncal or gait ataxia.  Sensory exam: intact to light touch in the upper and lower extremities.  Gait, station and balance: She stands without difficulty and posture is age-appropriate, she was with preserved arm swing, no shuffling noted, stands without obvious wide-based stance and walks with fairly narrow base.  Slight insecurity with turns.  Assessment and Plan:   In summary, Jezabelle Chisolm is a very pleasant 81 y.o.-year old  female with an underlying history of hyperlipidemia, hypertension, diabetes, weight loss, anemia, reflux disease, back pain, right shoulder pain, and vitamin D deficiency, who presents for evaluation of her  gait disorder, recent concern for parkinsonian gait changes.  On examination, she does not have any parkinsonian changes.  In particular, she does not have any shuffling of her gait, she has preserved arm swing, no resting or postural tremor, strength is good, reflexes symmetrical, nonfocal neurological exam.  She feels that she has improved, particularly with physical therapy recently through orthopedics.  She did have evidence of anemia and abnormal liver function test recently.  She is advised to follow-up with your office in that regard, she also had extensive blood work in the early part of May, we will request those results as well for completion.  I do not believe she needs any additional testing from my end of things at this time.  She is advised to be cautious when turning, she may benefit from using a cane.  She has a slightly improved appetite, I did encourage her to make sure she gets proper nutrition, hydrates well with water and proper rest at night.  We talked about the importance of fall prevention.  She is encouraged to use a cane for gait safety as needed.  I did not suggest any new medications from my end of things, I did ask her to make a follow-up appointment with her orthopedic specialist for her right shoulder pain.  Her low back pain has improved thankfully.  She is advised to keep her follow-up appointment on a regular basis with your office for her medical checkup and follow-up on anemia and liver function.  She was noted to have a systolic murmur, this does not seem to be new.  If feasible, and as you see fit, she may benefit from seeing a cardiologist.   I do not believe we need to make a scheduled appointment in this office, I would be happy to see her back on an as-needed basis. I  answered all their questions today and the patient and her husband were in agreement. Thank you very much for allowing me to participate in the care of this nice patient. If I can be of any further assistance to you please do not hesitate to call me at [phone].  Sincerely,   Star Age, MD, PhD

## 2021-09-24 ENCOUNTER — Ambulatory Visit: Payer: Medicare Other | Admitting: Podiatry

## 2021-09-24 ENCOUNTER — Encounter: Payer: Self-pay | Admitting: Podiatry

## 2021-09-24 DIAGNOSIS — L853 Xerosis cutis: Secondary | ICD-10-CM

## 2021-09-24 DIAGNOSIS — M79674 Pain in right toe(s): Secondary | ICD-10-CM

## 2021-09-24 DIAGNOSIS — M79675 Pain in left toe(s): Secondary | ICD-10-CM

## 2021-09-24 DIAGNOSIS — B351 Tinea unguium: Secondary | ICD-10-CM | POA: Diagnosis not present

## 2021-09-27 NOTE — Progress Notes (Signed)
  Subjective:  Patient ID: [name], female    DOB: 1940-02-19,  MRN: [id number]  Chief Complaint  Patient presents with   Tinea Pedis    NP skin on feet is dry and scaly    Diabetes    Diabetic foot exam   Nail Problem    Thick painful toenails    81 y.o. female presents with the above complaint. History confirmed with patient.  Dryness is improving quite a bit is nearly fully resolved  Objective:  Physical Exam: warm, good capillary refill, no trophic changes or ulcerative lesions, normal DP and PT pulses, and normal sensory exam. Left Foot: dystrophic yellowed discolored nail plates with subungual debris Right Foot: dystrophic yellowed discolored nail plates with subungual debris   Assessment:   1. Pain due to onychomycosis of toenails of both feet   2. Xerosis cutis      Plan:  Patient was evaluated and treated and all questions answered.  Xerosis cutis has resolved we discussed types of moisturization and skin health  Discussed the etiology and treatment options for the condition in detail with the patient. Educated patient on the topical and oral treatment options for mycotic nails. Recommended debridement of the nails today. Sharp and mechanical debridement performed of all painful and mycotic nails today. Nails debrided in length and thickness using a nail nipper to level of comfort. Discussed treatment options including appropriate shoe gear. Follow up as needed for painful nails.   Return in about 3 months (around 12/24/2021) for nail trim.

## 2021-12-28 ENCOUNTER — Encounter: Payer: Self-pay | Admitting: Podiatry

## 2021-12-28 ENCOUNTER — Ambulatory Visit: Payer: Medicare Other | Admitting: Podiatry

## 2021-12-28 VITALS — BP 178/67

## 2021-12-28 DIAGNOSIS — M79674 Pain in right toe(s): Secondary | ICD-10-CM | POA: Diagnosis not present

## 2021-12-28 DIAGNOSIS — B351 Tinea unguium: Secondary | ICD-10-CM

## 2021-12-28 DIAGNOSIS — M79675 Pain in left toe(s): Secondary | ICD-10-CM

## 2021-12-30 NOTE — Progress Notes (Signed)
  Subjective:  Patient ID: [name], female    DOB: 06/23/1940,  MRN: [id number]  [name] presents to clinic today for painful thick toenails that are difficult to trim. Pain interferes with ambulation. Aggravating factors include wearing enclosed shoe gear. Pain is relieved with periodic professional debridement.  Chief Complaint  Patient presents with   Nail Problem    RFC PCP-[facility] PCP VST-Early 2023   New problem(s): None.   PCP is [name], MD.  Allergies  Allergen Reactions   Estrogens     Bloating     Review of Systems: Negative except as noted in the HPI.  Objective: No changes noted in today's physical examination. Vitals:   12/28/21 1100  BP: (!) 178/67   [name] is a pleasant 81 y.o. female WD, WN in NAD. AAO x 3.  Vascular Examination: Capillary refill time immediate b/l. Vascular status intact b/l with palpable pedal pulses. Pedal hair present b/l. No edema. No pain with calf compression b/l. Skin temperature gradient WNL b/l. No cyanosis or clubbing noted b/l LE.  Neurological Examination: Sensation grossly intact b/l with 10 gram monofilament. Vibratory sensation intact b/l. Protective sensation intact 5/5 intact bilaterally with 10g monofilament b/l. Vibratory sensation intact b/l. Proprioception intact bilaterally.  Dermatological Examination: Pedal skin with normal turgor, texture and tone b/l. Toenails 1-5 b/l thick, discolored, elongated with subungual debris and pain on dorsal palpation. No open wounds b/l LE. No interdigital macerations noted b/l LE. No hyperkeratotic nor porokeratotic lesions present on today's visit.  Musculoskeletal Examination: Muscle strength 5/5 to all lower extremity muscle groups bilaterally. No pain, crepitus or joint limitation noted with ROM bilateral LE. No gross bony deformities bilaterally. Patient ambulates independent of any assistive aids.  Radiographs: None  Assessment/Plan: 1. Pain due  to onychomycosis of toenails of both feet     No orders of the defined types were placed in this encounter.  -Patient was evaluated and treated. All patient's and/or POA's questions/concerns answered on today's visit. -Patient to continue soft, supportive shoe gear daily. -Toenails 1-5 b/l were debrided in length and girth with sterile nail nippers and dremel without iatrogenic bleeding.  -Patient/POA to call should there be question/concern in the interim.   Return in about 3 months (around 03/29/2022).  [name], DPM

## 2022-04-19 ENCOUNTER — Encounter: Payer: Self-pay | Admitting: Podiatry

## 2022-04-19 ENCOUNTER — Ambulatory Visit (INDEPENDENT_AMBULATORY_CARE_PROVIDER_SITE_OTHER): Payer: Medicare Other | Admitting: Podiatry

## 2022-04-19 VITALS — BP 160/79 | HR 83

## 2022-04-19 DIAGNOSIS — M79674 Pain in right toe(s): Secondary | ICD-10-CM

## 2022-04-19 DIAGNOSIS — M79675 Pain in left toe(s): Secondary | ICD-10-CM | POA: Diagnosis not present

## 2022-04-19 DIAGNOSIS — B351 Tinea unguium: Secondary | ICD-10-CM | POA: Diagnosis not present

## 2022-04-19 NOTE — Progress Notes (Signed)
  Subjective:  Patient ID: [name], female    DOB: [birth year],  MRN: [id number]  Loanna Grandison presents to clinic today for painful elongated mycotic toenails 1-5 bilaterally which are tender when wearing enclosed shoe gear. Pain is relieved with periodic professional debridement.  Chief Complaint  Patient presents with   NAIL CARE    RFC   New problem(s): None.   PCP is [name], MD.  Allergies  Allergen Reactions   Estrogens     Bloating     Review of Systems: Negative except as noted in the HPI.  Objective: No changes noted in today's physical examination. Vitals:   04/19/22 1015  BP: (!) 160/79  Pulse: 83   Jesley Balka is a pleasant 82 y.o. female in NAD. AAO x 3.  Vascular Examination: Capillary refill time immediate b/l. Vascular status intact b/l with palpable pedal pulses. Pedal hair present b/l. No edema. No pain with calf compression b/l. Skin temperature gradient WNL b/l. No cyanosis or clubbing noted b/l LE.  Neurological Examination: Sensation grossly intact b/l with 10 gram monofilament. Vibratory sensation intact b/l. Protective sensation intact 5/5 intact bilaterally with 10g monofilament b/l. Vibratory sensation intact b/l. Proprioception intact bilaterally.  Dermatological Examination: Pedal skin with normal turgor, texture and tone b/l. Toenails 1-5 b/l thick, discolored, elongated with subungual debris and pain on dorsal palpation. No open wounds b/l LE. No interdigital macerations noted b/l LE. No hyperkeratotic nor porokeratotic lesions present on today's visit.  Musculoskeletal Examination: Muscle strength 5/5 to all lower extremity muscle groups bilaterally. No pain, crepitus or joint limitation noted with ROM bilateral LE. No gross bony deformities bilaterally. Patient ambulates independent of any assistive aids.  Radiographs: None  Assessment/Plan: 1. Pain due to onychomycosis of toenails of both feet     -Consent given for  treatment as described below: -Examined patient. -Toenails 1-5 b/l were debrided in length and girth with sterile nail nippers and dremel without iatrogenic bleeding.  -Patient/POA to call should there be question/concern in the interim.   Return in about 3 months (around 07/19/2022).  [name], DPM

## 2022-08-31 ENCOUNTER — Encounter: Payer: Self-pay | Admitting: Podiatry

## 2022-08-31 ENCOUNTER — Ambulatory Visit: Payer: Medicare Other | Admitting: Podiatry

## 2022-08-31 DIAGNOSIS — M79674 Pain in right toe(s): Secondary | ICD-10-CM | POA: Diagnosis not present

## 2022-08-31 DIAGNOSIS — E119 Type 2 diabetes mellitus without complications: Secondary | ICD-10-CM | POA: Diagnosis not present

## 2022-08-31 DIAGNOSIS — M79675 Pain in left toe(s): Secondary | ICD-10-CM

## 2022-08-31 DIAGNOSIS — L84 Corns and callosities: Secondary | ICD-10-CM | POA: Diagnosis not present

## 2022-08-31 DIAGNOSIS — B351 Tinea unguium: Secondary | ICD-10-CM

## 2022-09-05 NOTE — Progress Notes (Signed)
  Subjective:  Patient ID: [name], female    DOB: 01/26/1940,  MRN: [id number]  [name] presents to clinic today for preventative diabetic foot care and painful thick toenails that are difficult to trim. Pain interferes with ambulation. Aggravating factors include wearing enclosed shoe gear. Pain is relieved with periodic professional debridement.  Chief Complaint  Patient presents with   RFC    RFC   New problem(s): None.   PCP is [name], MD.  Allergies  Allergen Reactions   Estrogens     Bloating     Review of Systems: Negative except as noted in the HPI.  Objective: No changes noted in today's physical examination. There were no vitals filed for this visit. [name] is a pleasant 82 y.o. female WD, WN in NAD. AAO x 3.  Vascular Examination: Capillary refill time immediate b/l. Vascular status intact b/l with palpable pedal pulses. Pedal hair present b/l. No edema. No pain with calf compression b/l. Skin temperature gradient WNL b/l. No cyanosis or clubbing noted b/l LE.  Neurological Examination: Sensation grossly intact b/l with 10 gram monofilament. Vibratory sensation intact b/l. Protective sensation intact 5/5 intact bilaterally with 10g monofilament b/l. Vibratory sensation intact b/l. Proprioception intact bilaterally.  Dermatological Examination: Pedal skin with normal turgor, texture and tone b/l. Toenails 1-5 b/l thick, discolored, elongated with subungual debris and pain on dorsal palpation. No open wounds b/l LE. No interdigital macerations noted b/l LE. Hyperkeratotic lesion(s) bilateral 5th toes.  No erythema, no edema, no drainage, no fluctuance.  Musculoskeletal Examination: Muscle strength 5/5 to all lower extremity muscle groups bilaterally. No pain, crepitus or joint limitation noted with ROM bilateral LE. No gross bony deformities bilaterally. Patient ambulates independent of any assistive aids.  Radiographs:  None Assessment/Plan: 1. Pain due to onychomycosis of toenails of both feet   2. Corns   3. Type 2 diabetes mellitus without complication, without long-term current use of insulin (HCC)     -Patient was evaluated and treated. All patient's and/or POA's questions/concerns answered on today's visit. -Patient to continue soft, supportive shoe gear daily. -Mycotic toenails 1-5 bilaterally were debrided in length and girth with sterile nail nippers and dremel without incident. -Corn(s) bilateral 5th toes pared utilizing sterile scalpel blade without complication or incident. Total number debrided=2. -Dispensed tube foam. Apply to bilateral 5th toes every morning. Remove every evening. -Patient/POA to call should there be question/concern in the interim.   No follow-ups on file.  [name], DPM

## 2022-11-30 ENCOUNTER — Ambulatory Visit: Payer: Medicare Other | Admitting: Podiatry

## 2022-11-30 ENCOUNTER — Encounter: Payer: Self-pay | Admitting: Podiatry

## 2022-11-30 DIAGNOSIS — M79674 Pain in right toe(s): Secondary | ICD-10-CM

## 2022-11-30 DIAGNOSIS — B351 Tinea unguium: Secondary | ICD-10-CM | POA: Diagnosis not present

## 2022-11-30 DIAGNOSIS — M79675 Pain in left toe(s): Secondary | ICD-10-CM

## 2022-11-30 NOTE — Progress Notes (Unsigned)
       Subjective:  Patient ID: [name], female    DOB: Jun 04, 1940,  MRN: [id number]  [name] presents to clinic today for:  Chief Complaint  Patient presents with   Routine Post Op    PATIENT STATES SHE IS HERE TO GET HER TOE NAILS CUT , SHE STATES THE DOCTOR ASK HER THE QUESTIONS  . Patient notes nails are thick, discolored, elongated and painful in shoegear when trying to ambulate.    PCP is [name], MD.  Past Medical History:  Diagnosis Date   Allergy    Cataract    Heart murmur    Hyperglycemia    Hyperlipidemia    Hypertension    Microscopic hematuria    Past Surgical History:  Procedure Laterality Date   ABDOMINAL HYSTERECTOMY     ANAL RECTAL MANOMETRY N/A 12/30/2014   Procedure: ANO RECTAL MANOMETRY;  Surgeon: Napoleon Form, MD;  Location: [facility];  Service: Endoscopy;  Laterality: N/A;   CATARACT EXTRACTION Bilateral    2019   COLONOSCOPY     TOTAL ABDOMINAL HYSTERECTOMY Left 1985   TRIGGER FINGER RELEASE     left   Allergies  Allergen Reactions   Estrogens     Bloating    Review of Systems: Negative except as noted in the HPI.  Objective:  [name] is a pleasant 82 y.o. female in NAD. AAO x 3.  Vascular Examination: Capillary refill time is 3-5 seconds to toes bilateral. Palpable pedal pulses b/l LE. Digital hair present b/l.  Skin temperature gradient WNL b/l. No varicosities b/l. No cyanosis noted b/l.   Dermatological Examination: Pedal skin with normal turgor, texture and tone b/l. No open wounds. No interdigital macerations b/l. Toenails x10 are 3mm thick, discolored, dystrophic with subungual debris. There is pain with compression of the nail plates.  They are elongated x10  Assessment/Plan: 1. Pain due to onychomycosis of toenails of both feet    The mycotic toenails were sharply debrided x10 with sterile nail nippers and a power debriding burr to decrease bulk/thickness and length.    Return in about 3  months (around 03/02/2023) for RFC.   Clerance Lav, DPM, FACFAS [facility]     [address] [place], [place] [address]                Office [phone]  Fax [phone]

## 2023-03-30 ENCOUNTER — Encounter: Payer: Self-pay | Admitting: Podiatry

## 2023-03-30 ENCOUNTER — Ambulatory Visit: Payer: Medicare Other | Admitting: Podiatry

## 2023-03-30 DIAGNOSIS — M79674 Pain in right toe(s): Secondary | ICD-10-CM | POA: Diagnosis not present

## 2023-03-30 DIAGNOSIS — M2041 Other hammer toe(s) (acquired), right foot: Secondary | ICD-10-CM | POA: Diagnosis not present

## 2023-03-30 DIAGNOSIS — M79675 Pain in left toe(s): Secondary | ICD-10-CM

## 2023-03-30 DIAGNOSIS — M2042 Other hammer toe(s) (acquired), left foot: Secondary | ICD-10-CM | POA: Diagnosis not present

## 2023-03-30 DIAGNOSIS — L84 Corns and callosities: Secondary | ICD-10-CM

## 2023-03-30 DIAGNOSIS — E119 Type 2 diabetes mellitus without complications: Secondary | ICD-10-CM

## 2023-03-30 DIAGNOSIS — B351 Tinea unguium: Secondary | ICD-10-CM

## 2023-04-05 ENCOUNTER — Encounter: Payer: Self-pay | Admitting: Podiatry

## 2023-04-05 NOTE — Progress Notes (Signed)
 ANNUAL DIABETIC FOOT EXAM  Subjective: [name] presents today for annual diabetic foot exam.  Chief Complaint  Patient presents with   Nail Problem    "Trim my toenails."   Patient confirms h/o diabetes.  Patient denies any h/o foot wounds.  Sara Fillers, MD is patient's PCP.  Past Medical History:  Diagnosis Date   Allergy    Cataract    Heart murmur    Hyperglycemia    Hyperlipidemia    Hypertension    Microscopic hematuria    Patient Active Problem List   Diagnosis Date Noted   Low back pain 04/20/2021   Pain in left knee 05/07/2017   Hyperlipidemia 10/01/2016   Hypertension 10/01/2016   Fecal incontinence    Diabetes mellitus (HCC) 12/02/2010   Nuclear cataract 12/02/2010   Past Surgical History:  Procedure Laterality Date   ABDOMINAL HYSTERECTOMY     ANAL RECTAL MANOMETRY N/A 12/30/2014   Procedure: ANO RECTAL MANOMETRY;  Surgeon: Napoleon Form, MD;  Location: [facility];  Service: Endoscopy;  Laterality: N/A;   CATARACT EXTRACTION Bilateral    2019   COLONOSCOPY     TOTAL ABDOMINAL HYSTERECTOMY Left 1985   TRIGGER FINGER RELEASE     left   Current Outpatient Medications on File Prior to Visit  Medication Sig Dispense Refill   CALCIUM PO Take 600 mg by mouth daily.     ergocalciferol (VITAMIN D2) 50000 UNITS capsule Take 50,000 Units by mouth once a week. 50,000 units every other week     ezetimibe (ZETIA) 10 MG tablet Take 10 mg by mouth daily.     furosemide (LASIX) 20 MG tablet Take 20 mg by mouth daily.     losartan (COZAAR) 100 MG tablet Take 100 mg by mouth daily.     metoprolol succinate (TOPROL-XL) 100 MG 24 hr tablet Take 100 mg by mouth daily.     Multiple Vitamin (MULTIVITAMIN) tablet Take 1 tablet by mouth daily.     pantoprazole (PROTONIX) 40 MG tablet TAKE 1 TABLET BY MOUTH EVERY DAY FOR 30 DAYS     potassium chloride (KLOR-CON) 10 MEQ tablet Take 10 mEq by mouth daily.     predniSONE (DELTASONE) 10 MG tablet Take by  mouth.     No current facility-administered medications on file prior to visit.    Allergies  Allergen Reactions   Estrogens     Bloating    Social History   Occupational History   Not on file  Tobacco Use   Smoking status: Former   Smokeless tobacco: Never  Vaping Use   Vaping status: Never Used  Substance and Sexual Activity   Alcohol use: No   Drug use: No   Sexual activity: Not on file   Family History  Problem Relation Age of Onset   Goiter Mother    Parkinson's disease Father    Colon cancer Neg Hx    Esophageal cancer Neg Hx    Stomach cancer Neg Hx    Rectal cancer Neg Hx    Immunization History  Administered Date(s) Administered   PFIZER(Purple Top)SARS-COV-2 Vaccination 01/28/2019, 02/15/2019     Review of Systems: Negative except as noted in the HPI.   Objective: There were no vitals filed for this visit.  [name] is a pleasant 83 y.o. female in NAD. AAO X 3.  Diabetic foot exam was performed with the following findings:   Vascular Examination: Capillary refill time immediate b/l. Vascular status intact b/l with  palpable pedal pulses. Pedal hair present b/l. No pain with calf compression b/l. Skin temperature gradient WNL b/l. No cyanosis or clubbing b/l. No ischemia or gangrene noted b/l.   Neurological Examination: Sensation grossly intact b/l with 10 gram monofilament. Vibratory sensation intact b/l.   Dermatological Examination: Pedal skin with normal turgor, texture and tone b/l.  No open wounds. No interdigital macerations.   Toenails 1-5 b/l thick, discolored, elongated with subungual debris and pain on dorsal palpation.   Hyperkeratotic lesion(s) bilateral 5th toes.  No erythema, no edema, no drainage, no fluctuance.  Musculoskeletal Examination: Muscle strength 5/5 to all lower extremity muscle groups bilaterally. Hammertoe(s) bilateral 5th toes.  Radiographs: None     ADA Risk Categorization: Low Risk :  Patient has all of  the following: Intact protective sensation No prior foot ulcer  No severe deformity Pedal pulses present  Assessment: 1. Pain due to onychomycosis of toenails of both feet   2. Corns   3. Acquired hammertoes of both feet   4. Type 2 diabetes mellitus without complication, without long-term current use of insulin (HCC)   5. Encounter for diabetic foot exam [facility])      Plan: Consent given for treatment.Diabetic foot examination performed today. All patient's and/or POA's questions/concerns addressed on today's visit. Toenails 1-5 bilaterally debrided in length and girth without incident. Corn(s) bilateral 5th toes pared with sharp debridement without incident. Dispensed tube foam for daily padding of digits. Continue foot and shoe inspections daily. Monitor blood glucose per PCP/Endocrinologist's recommendations. Continue soft, supportive shoe gear daily. Report any pedal injuries to medical professional. Call office if there are any questions/concerns. -Patient/POA to call should there be question/concern in the interim. Return in about 3 months (around 06/30/2023).  [name], DPM      [place] LOCATION: [address], [place] [address]                   Office [phone]   [facility] LOCATION: [address] [place], [place] [address] Office [phone]

## 2023-07-19 ENCOUNTER — Ambulatory Visit: Admitting: Podiatry

## 2023-07-26 ENCOUNTER — Encounter: Payer: Self-pay | Admitting: Podiatry

## 2023-07-26 ENCOUNTER — Ambulatory Visit (INDEPENDENT_AMBULATORY_CARE_PROVIDER_SITE_OTHER): Admitting: Podiatry

## 2023-07-26 DIAGNOSIS — M79675 Pain in left toe(s): Secondary | ICD-10-CM | POA: Diagnosis not present

## 2023-07-26 DIAGNOSIS — M79674 Pain in right toe(s): Secondary | ICD-10-CM

## 2023-07-26 DIAGNOSIS — E119 Type 2 diabetes mellitus without complications: Secondary | ICD-10-CM

## 2023-07-26 DIAGNOSIS — B351 Tinea unguium: Secondary | ICD-10-CM

## 2023-07-26 NOTE — Progress Notes (Signed)
This patient returns to my office for at risk foot care.  This patient requires this care by a professional since this patient will be at risk due to having diabetes.    This patient is unable to cut nails herself since the patient cannot reach her nails.These nails are painful walking and wearing shoes.  This patient presents for at risk foot care today.    General Appearance  Alert, conversant and in no acute stress.  Vascular  Dorsalis pedis and posterior tibial  pulses are palpable  bilaterally.  Capillary return is within normal limits  bilaterally. Temperature is within normal limits  bilaterally.  Neurologic  Senn-Weinstein monofilament wire test within normal limits  bilaterally. Muscle power within normal limits bilaterally.  Nails Thick disfigured discolored nails with subungual debris  from hallux to fifth toes bilaterally. No evidence of bacterial infection or drainage bilaterally.  Orthopedic  No limitations of motion  feet .  No crepitus or effusions noted.  No bony pathology or digital deformities noted.  HAV  B/L  Skin  normotropic skin with no porokeratosis noted bilaterally.  No signs of infections or ulcers noted.   Onychomycosis  Pain in right toes  Pain in left toes    Consent was obtained for treatment procedures.   Mechanical debridement of nails 1-5  bilaterally performed with a nail nipper.  Filed with dremel without incident.    Return office visit    3 months                 Told patient to return for periodic foot care and evaluation due to potential at risk complications.   Delbert Darley DPM  

## 2023-10-05 IMAGING — CT CT ABD-PELV W/ CM
2 of 5 series · 16 of 46 positions shown, 18 images · IV contrast (APPLIED)
Comparison: 10/13/2012

CLINICAL DATA: Weight loss, blood in urine, anorexia

EXAM:
CT ABDOMEN AND PELVIS WITH CONTRAST
TECHNIQUE: Multidetector CT imaging of the abdomen and pelvis was performed
using the standard protocol following bolus administration of
intravenous contrast.

[Series 2: axial st · axial · 0.84mm/px · z∈[+1030,+1380]mm · 13 of 82 slices shown, 15 images]
[im 6/82  soft-tissue]
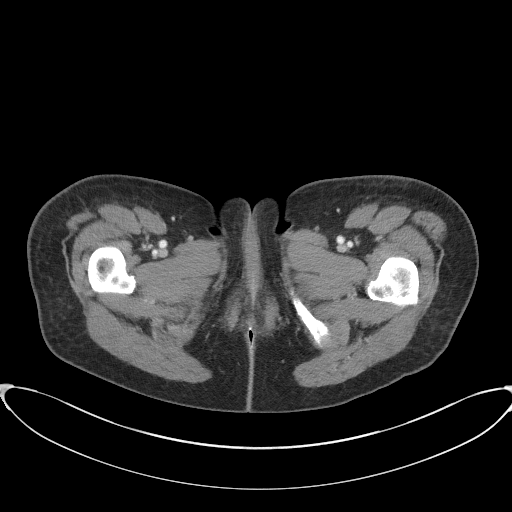
[im 6/82  bone]
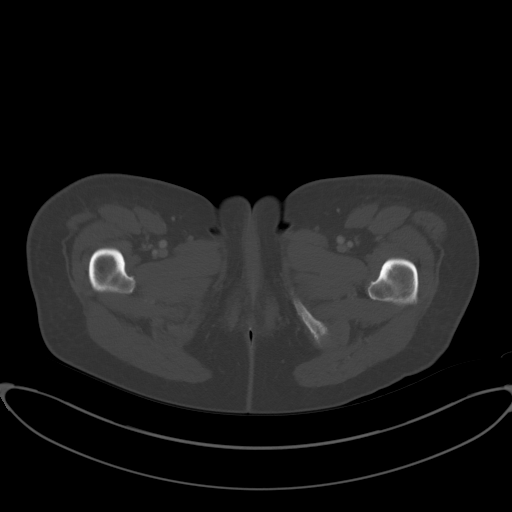
[im 11/82  soft-tissue]
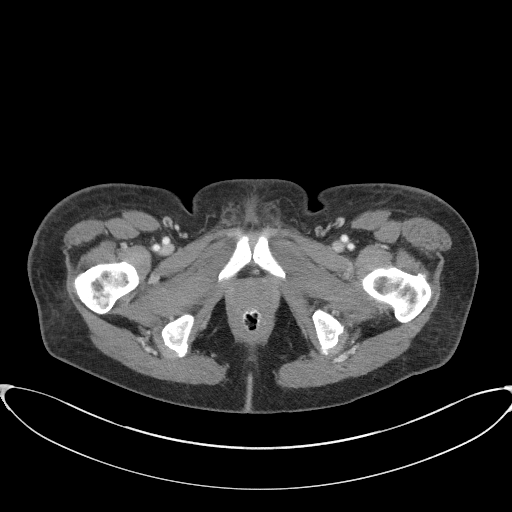
[im 17/82  soft-tissue]
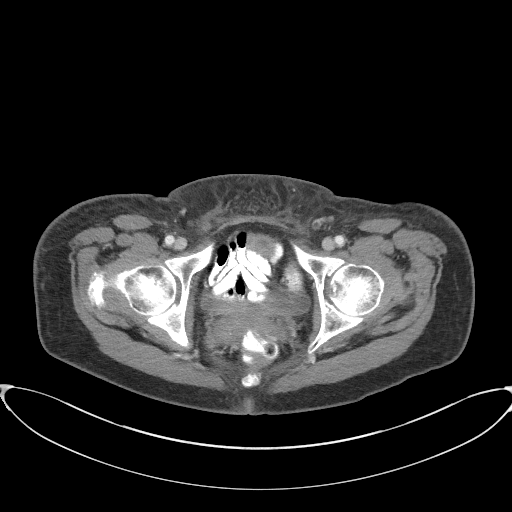
[im 22/82  soft-tissue]
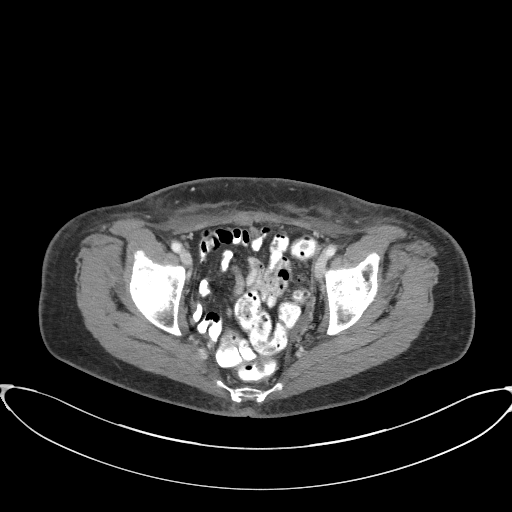
[im 28/82  soft-tissue]
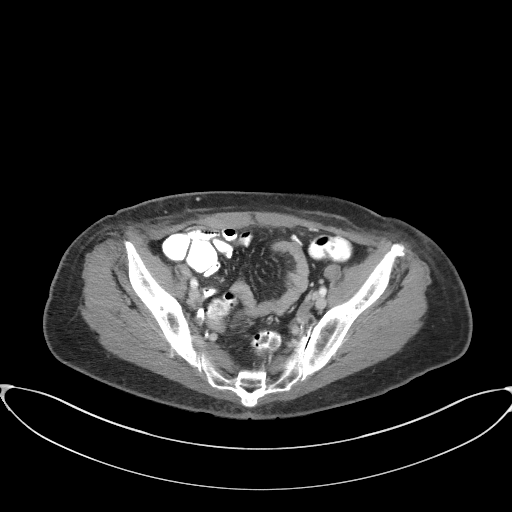
[im 33/82  soft-tissue]
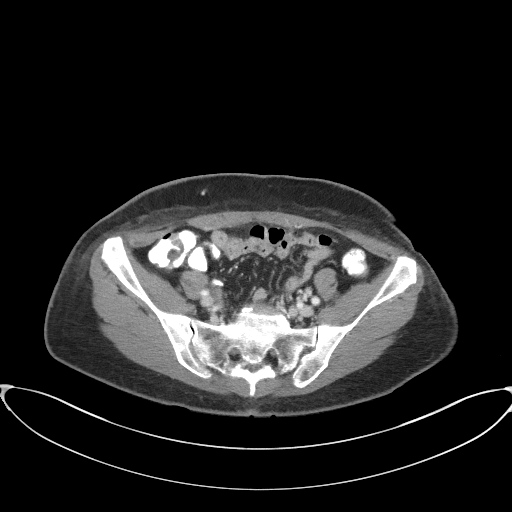
[im 44/82  soft-tissue]
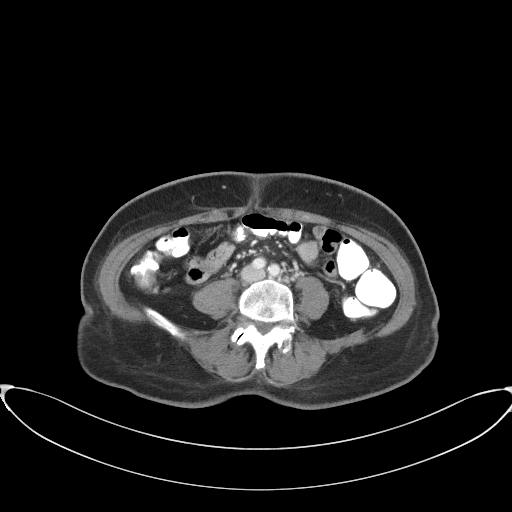
[im 49/82  soft-tissue]
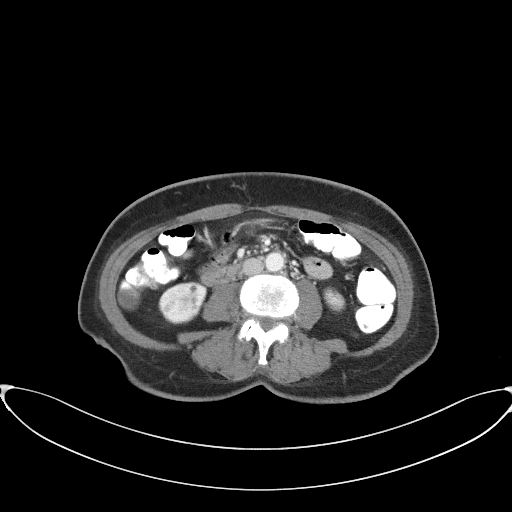
[im 55/82  soft-tissue]
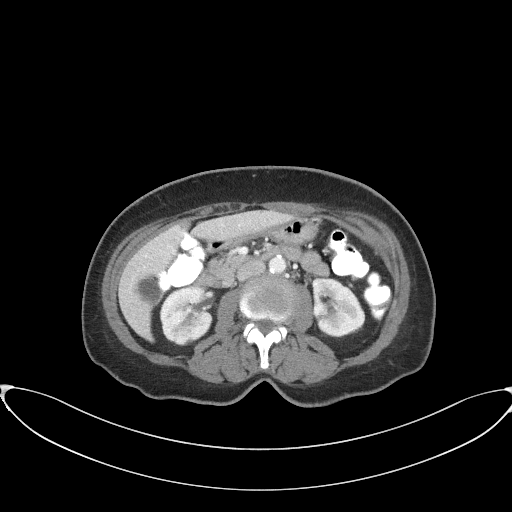
[im 55/82  bone]
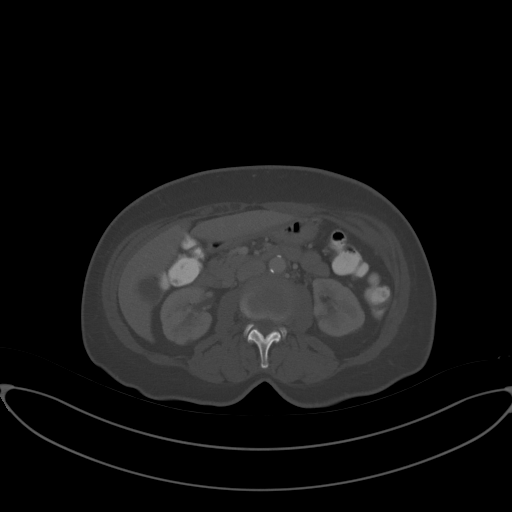
[im 60/82  soft-tissue]
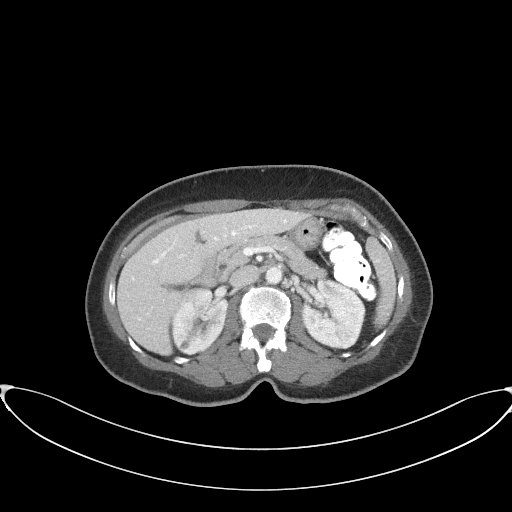
[im 65/82  soft-tissue]
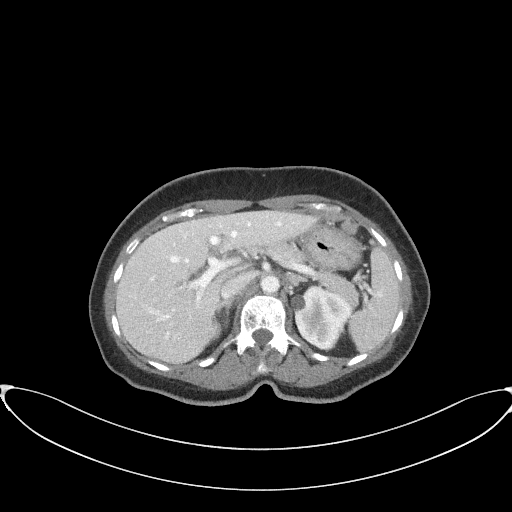
[im 71/82  soft-tissue]
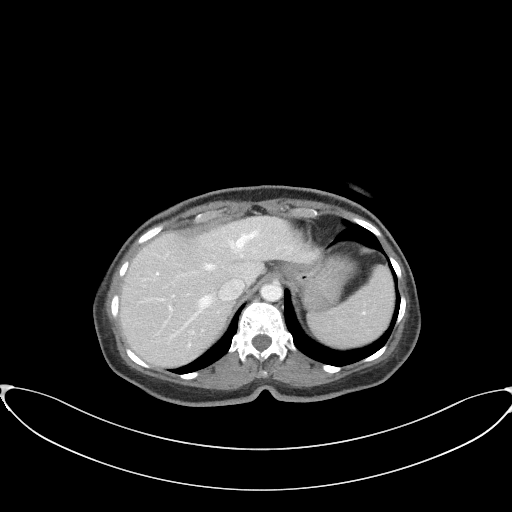
[im 76/82  soft-tissue]
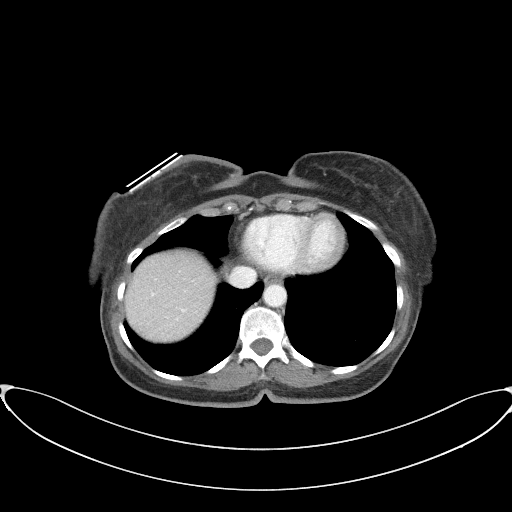

[Series 5: coronal st · coronal · 0.75mm/px · 3 of 92 slices shown]
[im 31/92  soft-tissue]
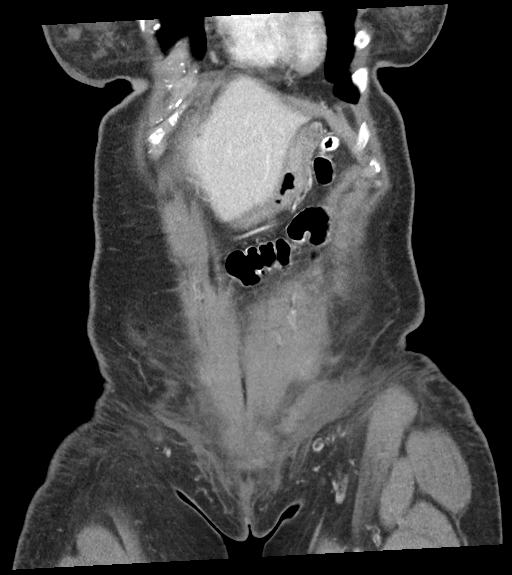
[im 41/92  soft-tissue]
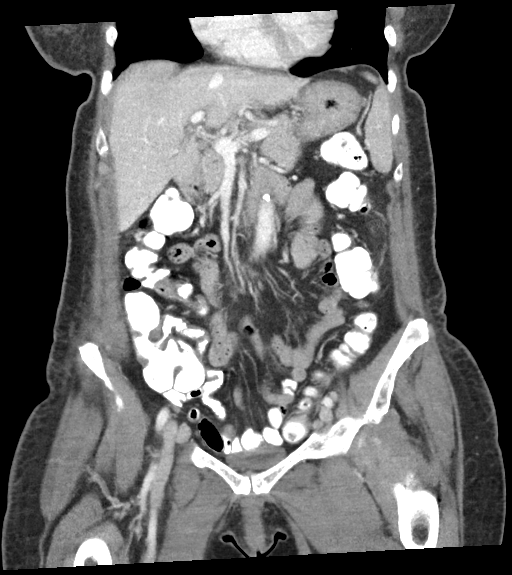
[im 51/92  soft-tissue]
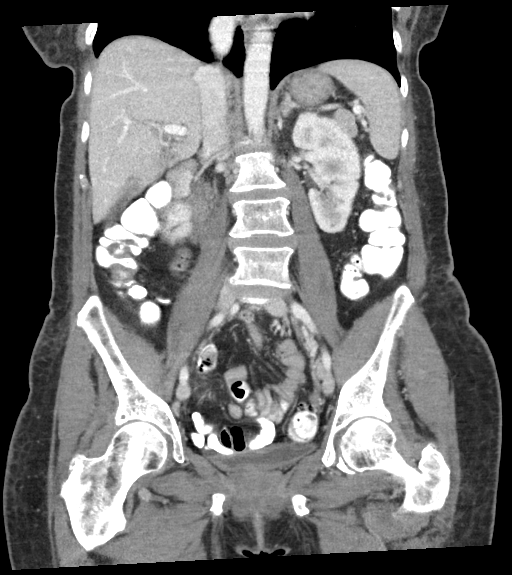

[16 of 46 positions shown; findings below may reference images not displayed]

RADIATION DOSE REDUCTION: This exam was performed according to the
departmental dose-optimization program which includes automated
exposure control, adjustment of the mA and/or kV according to
patient size and/or use of iterative reconstruction technique.

CONTRAST:  100mL OMNIPAQUE IOHEXOL 300 MG/ML SOLN IV. Dilute oral
contrast.
FINDINGS: Lower chest: 5 mm RIGHT lower lobe nodule image 5 unchanged; no
follow-up imaging recommended. Remaining lung bases clear.

Hepatobiliary: Tiny cyst anterior LEFT lobe liver 4 mm diameter
image 13. Gallbladder and liver otherwise normal appearance

Pancreas: Normal appearance

Spleen: Normal appearance

Adrenals/Urinary Tract: Adrenal glands normal appearance. Numerous
small BILATERAL renal cysts, largest 12 mm; no follow-up imaging
recommended. Kidneys, ureters, and bladder otherwise normal
appearance.

Stomach/Bowel: Normal appendix. Minimal sigmoid diverticulosis
without evidence of diverticulitis. Stomach and bowel loops
otherwise normal appearance.

Vascular/Lymphatic: Atherosclerotic calcifications aorta without
aneurysm. Vascular structures patent. No adenopathy.

Reproductive: Uterus surgically absent. Normal appearing LEFT ovary
with questionable visualization of a small RIGHT ovary.

Other: No free air or free fluid. No hernia or inflammatory process.

Musculoskeletal: No acute osseous findings. Minimal anterolisthesis
L4-L5 with bulging disc.
IMPRESSION: Minimal sigmoid diverticulosis without evidence of diverticulitis.

No acute intra-abdominal or intrapelvic abnormalities.

Aortic Atherosclerosis ([id number]).

## 2023-11-08 ENCOUNTER — Encounter: Payer: Self-pay | Admitting: Podiatry

## 2023-11-08 ENCOUNTER — Ambulatory Visit: Admitting: Podiatry

## 2023-11-08 DIAGNOSIS — M79674 Pain in right toe(s): Secondary | ICD-10-CM

## 2023-11-08 DIAGNOSIS — B351 Tinea unguium: Secondary | ICD-10-CM

## 2023-11-08 DIAGNOSIS — M79675 Pain in left toe(s): Secondary | ICD-10-CM

## 2023-11-13 NOTE — Progress Notes (Signed)
  Subjective:  Patient ID: [name], female    DOB: December 06, 1940,  MRN: [id number]  [name] presents to clinic today for painful mycotic toenails of both feet that are difficult to trim. Pain interferes with daily activities and wearing enclosed shoe gear comfortably.  Chief Complaint  Patient presents with   Nail Problem    My toenails    New problem(s): None.   PCP is Yolande Toribio MATSU, MD.  Allergies  Allergen Reactions   Estrogens     Bloating     Review of Systems: Negative except as noted in the HPI.  Objective: No changes noted in today's physical examination. There were no vitals filed for this visit. [name] is a pleasant 83 y.o. female WD, WN in NAD. AAO x 3.  Vascular Examination: Capillary refill time immediate b/l. Vascular status intact b/l with palpable pedal pulses. Pedal hair present b/l. No pain with calf compression b/l. Skin temperature gradient WNL b/l. No cyanosis or clubbing b/l. No ischemia or gangrene noted b/l.   Neurological Examination: Sensation grossly intact b/l with 10 gram monofilament. Vibratory sensation intact b/l.   Dermatological Examination: Pedal skin with normal turgor, texture and tone b/l.  No open wounds. No interdigital macerations.   Toenails 1-5 b/l thick, discolored, elongated with subungual debris and pain on dorsal palpation.   No hyperkeratotic nor porokeratotic lesions.  Musculoskeletal Examination: Muscle strength 5/5 to all lower extremity muscle groups bilaterally. Hammertoe(s) b/l 5th toes.. No pain, crepitus or joint limitation noted with ROM b/l LE.  Patient ambulates independently without assistive aids.  Radiographs: None  Assessment/Plan: 1. Pain due to onychomycosis of toenails of both feet     Patient was evaluated and treated. All patient's and/or POA's questions/concerns addressed on today's visit. Mycotic toenails 1-5 b/l debrided in length and girth without incident. Continue soft,  supportive shoe gear daily. Report any pedal injuries to medical professional. Call office if there are any quesitons/concerns. -Patient/POA to call should there be question/concern in the interim.   Return in about 3 months (around 02/08/2024).  Delon LITTIE Merlin, DPM      [place] LOCATION: [address], [place] [address]                   Office [phone]   [facility] LOCATION: [address] [place], [place] [address] Office [phone]

## 2024-02-22 ENCOUNTER — Ambulatory Visit: Admitting: Podiatry
